# Patient Record
Sex: Male | Born: 1985 | Race: Black or African American | Hispanic: No | Marital: Single | Smoking: Current some day smoker
Health system: Southern US, Community
[De-identification: ages and names within clinical notes are randomized; demographics above are authoritative.]

## PROBLEM LIST (undated history)

## (undated) DIAGNOSIS — J40 Bronchitis, not specified as acute or chronic: Secondary | ICD-10-CM

## (undated) DIAGNOSIS — R42 Dizziness and giddiness: Secondary | ICD-10-CM

## (undated) DIAGNOSIS — J45909 Unspecified asthma, uncomplicated: Secondary | ICD-10-CM

---

## 2011-05-25 ENCOUNTER — Encounter: Payer: Self-pay | Admitting: *Deleted

## 2011-05-25 ENCOUNTER — Emergency Department (HOSPITAL_COMMUNITY)
Admission: EM | Admit: 2011-05-25 | Discharge: 2011-05-25 | Disposition: A | Discharge status: Home / Self Care | Payer: Self-pay | Attending: Emergency Medicine | Admitting: Emergency Medicine

## 2011-05-25 DIAGNOSIS — F17200 Nicotine dependence, unspecified, uncomplicated: Secondary | ICD-10-CM | POA: Insufficient documentation

## 2011-05-25 DIAGNOSIS — L0501 Pilonidal cyst with abscess: Secondary | ICD-10-CM | POA: Insufficient documentation

## 2011-05-25 DIAGNOSIS — M545 Low back pain, unspecified: Secondary | ICD-10-CM | POA: Insufficient documentation

## 2011-05-25 DIAGNOSIS — IMO0001 Reserved for inherently not codable concepts without codable children: Secondary | ICD-10-CM | POA: Insufficient documentation

## 2011-05-25 MED ORDER — OXYCODONE-ACETAMINOPHEN 5-325 MG PO TABS: 1.0000 | ORAL_TABLET | ORAL | Status: AC | PRN

## 2011-05-25 MED ORDER — DOXYCYCLINE HYCLATE 100 MG PO CAPS: 100.0000 mg | ORAL_CAPSULE | Freq: Two times a day (BID) | ORAL | Status: AC

## 2011-05-25 NOTE — ED Provider Notes (Signed)
History     CSN: [id number]  Arrival date & time 05/25/11  1441   First MD Initiated Contact with Patient 05/25/11 1635      Chief Complaint  Patient presents with  . Back Pain    (Consider location/radiation/quality/duration/timing/severity/associated sxs/prior treatment) Patient is a 26 y.o. male presenting with back pain. The history is provided by the patient and the spouse.  Back Pain  This is a new problem. Episode onset: 4 days ago. The problem has been gradually worsening. The pain is associated with no known injury. The pain is present in the gluteal region. The quality of the pain is described as aching. Radiates to: radiates to lower back. The pain is at a severity of 10/10. The pain is severe. Exacerbated by: sitting. Pertinent negatives include no chest pain, no fever, no numbness, no headaches, no abdominal pain, no perianal numbness, no paresthesias and no weakness. He has tried nothing for the symptoms.    History reviewed. No pertinent past medical history.  History reviewed. No pertinent past surgical history.  History reviewed. No pertinent family history.  History  Substance Use Topics  . Smoking status: Current Some Day Smoker  . Smokeless tobacco: Not on file  . Alcohol Use: Yes      Review of Systems  Constitutional: Negative for fever.  HENT: Negative for congestion, sore throat and neck pain.   Eyes: Negative.   Respiratory: Negative for chest tightness and shortness of breath.   Cardiovascular: Negative for chest pain.  Gastrointestinal: Negative for nausea and abdominal pain.  Genitourinary: Negative.   Musculoskeletal: Positive for back pain. Negative for joint swelling and arthralgias.  Skin: Negative.  Negative for rash and wound.  Neurological: Negative for dizziness, weakness, light-headedness, numbness, headaches and paresthesias.  Hematological: Negative.   Psychiatric/Behavioral: Negative.     Allergies  Review of patient's  allergies indicates no known allergies.  Home Medications   Current Outpatient Rx  Name Route Sig Dispense Refill  . DOXYCYCLINE HYCLATE 100 MG PO CAPS Oral Take 1 capsule (100 mg total) by mouth 2 (two) times daily. 20 capsule 0  . OXYCODONE-ACETAMINOPHEN 5-325 MG PO TABS Oral Take 1 tablet by mouth every 4 (four) hours as needed for pain. 20 tablet 0    BP 106/79  Pulse 89  Temp(Src) 99.6 F (37.6 C) (Oral)  Resp 20  SpO2 100%  Physical Exam  Nursing note and vitals reviewed. Constitutional: He is oriented to person, place, and time. He appears well-developed and well-nourished.  HENT:  Head: Normocephalic and atraumatic.  Eyes: Conjunctivae are normal.  Neck: Normal range of motion.  Cardiovascular: Normal rate, regular rhythm, normal heart sounds and intact distal pulses.   Pulmonary/Chest: Effort normal and breath sounds normal. He has no wheezes.  Abdominal: Soft. Bowel sounds are normal. There is no tenderness.  Musculoskeletal: Normal range of motion.  Neurological: He is alert and oriented to person, place, and time.  Skin: Skin is warm and dry.       Small area of induration and tenderness of right upper buttock near midline.  No fluctuance,  Skin lesion or drainage.  No erythema noted.  Psychiatric: He has a normal mood and affect.    ED Course  Procedures (including critical care time)  Labs Reviewed - No data to display No results found.   1. Pilonidal abscess     Bedside US used to assess for abscess/pus pocket with no pus pocket identified.  MDM  Doxycycline,  Oxycodone,  Warm soaks.  Pt advised he may need to return if sx worsen for I & D.  Pt understands.        Candis Musa, PA 05/25/11 1723

## 2011-05-25 NOTE — ED Notes (Signed)
Back pain for 3-4 days,  Feels better when standing, no urinary sx. Points to sacral area as site of pain

## 2011-05-25 NOTE — ED Notes (Signed)
Back pain for 4 days.  

## 2011-05-27 NOTE — ED Provider Notes (Signed)
Medical screening examination/treatment/procedure(s) were performed by non-physician practitioner and as supervising physician I was immediately available for consultation/collaboration.  Nicoletta Dress. Colon Branch, MD 05/27/11 1557

## 2015-03-14 ENCOUNTER — Emergency Department (HOSPITAL_COMMUNITY)
Admission: EM | Admit: 2015-03-14 | Discharge: 2015-03-14 | Disposition: A | Discharge status: Home / Self Care | Payer: Self-pay | Attending: Emergency Medicine | Admitting: Emergency Medicine

## 2015-03-14 ENCOUNTER — Encounter (HOSPITAL_COMMUNITY): Payer: Self-pay | Admitting: Emergency Medicine

## 2015-03-14 DIAGNOSIS — Z720 Tobacco use: Secondary | ICD-10-CM | POA: Insufficient documentation

## 2015-03-14 DIAGNOSIS — R112 Nausea with vomiting, unspecified: Secondary | ICD-10-CM | POA: Insufficient documentation

## 2015-03-14 DIAGNOSIS — R42 Dizziness and giddiness: Secondary | ICD-10-CM | POA: Insufficient documentation

## 2015-03-14 DIAGNOSIS — Z8709 Personal history of other diseases of the respiratory system: Secondary | ICD-10-CM | POA: Insufficient documentation

## 2015-03-14 HISTORY — DX: Dizziness and giddiness: R42

## 2015-03-14 HISTORY — DX: Bronchitis, not specified as acute or chronic: J40

## 2015-03-14 MED ORDER — MECLIZINE HCL 50 MG PO TABS: 50.0000 mg | ORAL_TABLET | Freq: Three times a day (TID) | ORAL | Status: DC | PRN

## 2015-03-14 MED ORDER — MECLIZINE HCL 12.5 MG PO TABS
25.0000 mg | ORAL_TABLET | Freq: Once | ORAL | Status: AC
  Administered 2015-03-14: 25 mg via ORAL
  Filled 2015-03-14: qty 2

## 2015-03-14 NOTE — ED Notes (Addendum)
Patient c/o vertigo. Per patient feels like room is spinning when he stands.Patient states dizziness started yesterday along with nausea. Denies any nausea at this time.  Denies any slurred speech, facial drooping, weakness, or headache. No neurological deficits noted. Patient reports taking medication for vertigo in past.

## 2015-03-14 NOTE — ED Provider Notes (Signed)
CSN: [id number]     Arrival date & time 03/14/15  1118 History  By signing my name below, I, Soijett Blue, attest that this documentation has been prepared under the direction and in the presence of Jerelyn Scott, MD. Electronically Signed: Soijett Blue, ED Scribe. 03/14/2015. 12:22 PM.  Chief Complaint  Patient presents with  . Dizziness      Patient is a 29 y.o. male presenting with dizziness. The history is provided by the patient. No language interpreter was used.  Dizziness Quality:  Room spinning Severity:  Mild Onset quality:  Sudden Duration:  1 day Timing:  Constant Progression:  Unchanged Chronicity:  New Context: standing up   Relieved by:  Being still Worsened by:  Movement, standing up and turning head Associated symptoms: vomiting (i episode yesterday that resolved today)   Associated symptoms: no headaches, no nausea, no vision changes and no weakness   Risk factors: hx of vertigo     HPI Comments:  Danny Joyce is a 29 y.o. male with a medical hx of vertigo who presents to the Emergency Department complaining of dizziness onset yesterday at work. He notes that it feels as if the room is spinning when he is standing and turning his head and that these symptoms are similar to his vertigo. He states that he is having associated symptoms of nausea and vomiting x yesterday but none today. He states that he has tried dramamine PTA with no relief for his symptoms. He denies HA, weakness in arms/legs, head trauma, and any other symptoms. Denies allergies to any medications.   Past Medical History  Diagnosis Date  . Vertigo   . Bronchitis    History reviewed. No pertinent past surgical history. Family History  Problem Relation Age of Onset  . Asthma Mother   . Asthma Sister    Social History  Substance Use Topics  . Smoking status: Current Some Day Smoker -- 0.02 packs/day for 2 years    Types: Cigarettes  . Smokeless tobacco: Never Used  . Alcohol Use: No     Review of Systems  Gastrointestinal: Positive for vomiting (i episode yesterday that resolved today). Negative for nausea.  Neurological: Positive for dizziness. Negative for weakness and headaches.  All other systems reviewed and are negative.     Allergies  Review of patient's allergies indicates no known allergies.  Home Medications   Prior to Admission medications   Medication Sig Start Date End Date Taking? Authorizing Provider  dimenhyDRINATE (DRAMAMINE) 50 MG tablet Take 100 mg by mouth daily as needed for dizziness.   Yes Historical Provider, MD  meclizine (ANTIVERT) 50 MG tablet Take 1 tablet (50 mg total) by mouth 3 (three) times daily as needed. 03/14/15   Jerelyn Scott, MD   BP 120/85 mmHg  Pulse 55  Temp(Src) 97.8 F (36.6 C) (Oral)  Resp 18  Ht  (1.727 m)  Wt 200 lb (90.719 kg)  BMI 30.42 kg/m2  SpO2 95%  Vitals reviewed Physical Exam  Physical Examination: General appearance - alert, well appearing, and in no distress Mental status - alert, oriented to person, place, and time Eyes - pupils equal and reactive, extraocular eye movements intact, no nystagmus, but symptoms are re-created with EOM Mouth - mucous membranes moist, pharynx normal without lesions Neck - supple, no significant adenopathy Chest - clear to auscultation, no wheezes, rales or rhonchi, symmetric air entry Heart - normal rate, regular rhythm, normal S1, S2, no murmurs, rubs, clicks or gallops Abdomen -  soft, nontender, nondistended, no masses or organomegaly Neurological - alert, oriented x 3, cranial nerves 2-12 tested and intact, strength 5/5 in extremities x 4, sensation intact Extremities - peripheral pulses normal, no pedal edema, no clubbing or cyanosis Skin - normal coloration and turgor, no rashes  ED Course  Procedures (including critical care time) DIAGNOSTIC STUDIES: Oxygen Saturation is 100% on RA, nl by my interpretation.    COORDINATION OF CARE: 12:21 PM  Discussed treatment plan with pt at bedside which includes antivert and pt agreed to plan.    Labs Review Labs Reviewed - No data to display  Imaging Review No results found.    EKG Interpretation None      MDM   Final diagnoses:  Vertigo    Pt presenting with c/o vertigo- he has had similar symptoms in the past.  Vertigo is much worse on movement of head and with change in position.  Had one episode of vomiting when symptoms began.  Tried taking dramamine without much relief.  symptosm are most c/w peripheral vertigo, I have low suspicion for central vertigo.  Pt treated with meclizine. Normal neuro exam, symtoms reproducible with turning head.  Discharged with strict return precautions.  Pt agreeable with plan.  Julious PayerI, LINKER,MARTHA K, personally performed the services described in this documentation. All medical record entries made by the scribe were at my direction and in my presence.  I have reviewed the chart and discharge instructions and agree that the record reflects my personal performance and is accurate and complete. Ethelda ChickLINKER,MARTHA K.  03/15/2015. 7:48 AM.     Jerelyn ScottMartha Linker, MD 03/15/15 [phone]

## 2015-03-14 NOTE — Discharge Instructions (Signed)
Return to the ED with any concerns including vomiting and not able to keep down liquids, changes in vision or speech, weakness of arms or legs, fainting, decreased level of alertness/lethargy, or any other alarming symptoms °

## 2015-07-03 NOTE — Addendum Note (Signed)
Encounter addended by: Jerelyn Scott, MD on: 07/03/2015  8:08 PM<BR>     Documentation filed: Letters

## 2015-08-20 ENCOUNTER — Emergency Department (HOSPITAL_COMMUNITY): Payer: Self-pay

## 2015-08-20 ENCOUNTER — Encounter (HOSPITAL_COMMUNITY): Payer: Self-pay | Admitting: Emergency Medicine

## 2015-08-20 ENCOUNTER — Emergency Department (HOSPITAL_COMMUNITY)
Admission: EM | Admit: 2015-08-20 | Discharge: 2015-08-20 | Disposition: A | Discharge status: Home / Self Care | Payer: Self-pay | Attending: Emergency Medicine | Admitting: Emergency Medicine

## 2015-08-20 DIAGNOSIS — Y939 Activity, unspecified: Secondary | ICD-10-CM | POA: Insufficient documentation

## 2015-08-20 DIAGNOSIS — S46912A Strain of unspecified muscle, fascia and tendon at shoulder and upper arm level, left arm, initial encounter: Secondary | ICD-10-CM

## 2015-08-20 DIAGNOSIS — Y999 Unspecified external cause status: Secondary | ICD-10-CM | POA: Insufficient documentation

## 2015-08-20 DIAGNOSIS — S46812A Strain of other muscles, fascia and tendons at shoulder and upper arm level, left arm, initial encounter: Secondary | ICD-10-CM | POA: Insufficient documentation

## 2015-08-20 DIAGNOSIS — S40022A Contusion of left upper arm, initial encounter: Secondary | ICD-10-CM | POA: Insufficient documentation

## 2015-08-20 DIAGNOSIS — F17210 Nicotine dependence, cigarettes, uncomplicated: Secondary | ICD-10-CM | POA: Insufficient documentation

## 2015-08-20 DIAGNOSIS — Y929 Unspecified place or not applicable: Secondary | ICD-10-CM | POA: Insufficient documentation

## 2015-08-20 DIAGNOSIS — S40012A Contusion of left shoulder, initial encounter: Secondary | ICD-10-CM

## 2015-08-20 DIAGNOSIS — X58XXXA Exposure to other specified factors, initial encounter: Secondary | ICD-10-CM | POA: Insufficient documentation

## 2015-08-20 MED ORDER — ACETAMINOPHEN 325 MG PO TABS
650.0000 mg | ORAL_TABLET | Freq: Once | ORAL | Status: AC
  Administered 2015-08-20: 650 mg via ORAL
  Filled 2015-08-20: qty 2

## 2015-08-20 MED ORDER — IBUPROFEN 800 MG PO TABS
800.0000 mg | ORAL_TABLET | Freq: Once | ORAL | Status: AC
  Administered 2015-08-20: 800 mg via ORAL
  Filled 2015-08-20: qty 1
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
## 2015-08-20 NOTE — Discharge Instructions (Signed)
The x-ray of your shoulder, and the x-ray of your elbow are negative for fracture or dislocation. Your vital signs are within normal limits. Please use Tylenol every 4 hours, or ibuprofen every 6 hours for soreness. Contusion A contusion is a deep bruise. Contusions happen when an injury causes bleeding under the skin. Symptoms of bruising include pain, swelling, and discolored skin. The skin may turn blue, purple, or yellow. HOME CARE   Rest the injured area.  If told, put ice on the injured area.  Put ice in a plastic bag.  Place a towel between your skin and the bag.  Leave the ice on for 20 minutes, 2-3 times per day.  If told, put light pressure (compression) on the injured area using an elastic bandage. Make sure the bandage is not too tight. Remove it and put it back on as told by your doctor.  If possible, raise (elevate) the injured area above the level of your heart while you are sitting or lying down.  Take over-the-counter and prescription medicines only as told by your doctor. GET HELP IF:  Your symptoms do not get better after several days of treatment.  Your symptoms get worse.  You have trouble moving the injured area. GET HELP RIGHT AWAY IF:   You have very bad pain.  You have a loss of feeling (numbness) in a hand or foot.  Your hand or foot turns pale or cold.   This information is not intended to replace advice given to you by your health care provider. Make sure you discuss any questions you have with your health care provider.   Document Released: 10/26/2007 Document Revised: 01/28/2015 Document Reviewed: 09/24/2014 Elsevier Interactive Patient Education [employer]2016 Elsevier Inc.

## 2017-08-12 ENCOUNTER — Emergency Department (HOSPITAL_COMMUNITY): Payer: Self-pay

## 2017-08-12 ENCOUNTER — Encounter (HOSPITAL_COMMUNITY): Payer: Self-pay | Admitting: Cardiology

## 2017-08-12 ENCOUNTER — Emergency Department (HOSPITAL_COMMUNITY)
Admission: EM | Admit: 2017-08-12 | Discharge: 2017-08-12 | Disposition: A | Discharge status: Home / Self Care | Payer: Self-pay | Attending: Emergency Medicine | Admitting: Emergency Medicine

## 2017-08-12 DIAGNOSIS — B078 Other viral warts: Secondary | ICD-10-CM | POA: Insufficient documentation

## 2017-08-12 DIAGNOSIS — F17210 Nicotine dependence, cigarettes, uncomplicated: Secondary | ICD-10-CM | POA: Insufficient documentation

## 2017-08-12 MED ORDER — SALICYLIC ACID 17 % EX SOLN: Freq: Two times a day (BID) | CUTANEOUS | 1 refills | Status: DC

## 2017-08-12 NOTE — ED Provider Notes (Signed)
[facility]Y [facility]PART[facility] Provider Note   CSN: [id number] Arrival date & time: 08/12/17  1113     History   Chief Complaint No chief complaint on file.   HPI Danny Joyce is a 32 y.o. male, left handed, presenting with pain at the site of hard "knot" which may be a callus that has been on his right hand for chronically.  He describes occasionally paring it down using a pedi egg.  He did this yesterday and reports he may have gone too deep as the site bled briefly but now has become painful. 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  He denies purulent drainage from the site. Denies any known trauma/puncture or h/o foreign body to this site.  The history is provided by the patient.    Past Medical History:  Diagnosis Date  . Bronchitis   . Vertigo     There are no active problems to display for this patient.   History reviewed. No pertinent surgical history.      Home Medications    Prior to Admission medications   Medication Sig Start Date End Date Taking? Authorizing Provider  dimenhyDRINATE (DRAMAMINE) 50 MG tablet Take 100 mg by mouth daily as needed for dizziness.    [provider]  meclizine (ANTIVERT) 50 MG tablet Take 1 tablet (50 mg total) by mouth 3 (three) times daily as needed. 03/14/15   Mabe, Latanya MaudlinMartha L, MD  salicylic acid-lactic acid 17 % external solution Apply topically 2 (two) times daily. Apply twice daily to affected site and let dry completely 08/12/17   Burgess AmorIdol, Elisama Thissen, PA-C    Family History Family History  Problem Relation Age of Onset  . Asthma Mother   . Asthma Sister     Social History Social History   Tobacco Use  . Smoking status: Current Some Day Smoker    Packs/day: 0.02    Years: 2.00    Pack years: 0.04    Types: Cigarettes  . Smokeless tobacco: Never Used  Substance Use Topics  . Alcohol use: No  . Drug use: No     Allergies     Patient has no known allergies.   Review of Systems Review of Systems  Constitutional: Negative for chills and fever.  HENT: Negative.   Respiratory: Negative.   Cardiovascular: Negative.   Musculoskeletal: Negative.   Skin: Positive for wound.  Neurological: Negative for weakness and numbness.     Physical Exam Updated Vital Signs BP 128/75 (BP Location: Left Arm)   Pulse 80   Temp 98.3 F (36.8 C) (Oral)   Resp 16   Ht 5\' 8"  (1.727 m)   Wt 90.7 kg (200 lb)   SpO2 96%   BMI 30.41 kg/m   Physical Exam  Constitutional: He is oriented to person, place, and time. He appears well-developed and well-nourished.  HENT:  Head: Normocephalic.  Cardiovascular: Normal rate.  Pulmonary/Chest: Effort normal.  Musculoskeletal: He exhibits tenderness.  Raised hyperkerototic lesion 4 cm diameter right volar hand midway between the thumb and index finger.  Central dark spot noted. No surrounding erythema, no red streaking. No edema.  Neurological: He is alert and oriented to person, place, and time. No sensory deficit.  Skin: Laceration noted.     ED Treatments / Results  Labs (all labs ordered are listed, but only abnormal results are displayed) Labs Reviewed - No data to display  EKG None  Radiology Dg Hand Complete Right  Result Date: 08/12/2017 CLINICAL DATA:  Right hand pain after standing down a callus 2 days ago. Clinical concern for a foreign body. EXAM: RIGHT HAND - COMPLETE 3+ VIEW COMPARISON:  None. FINDINGS: There is no evidence of fracture or dislocation. There is no evidence of arthropathy or other focal bone abnormality. Soft tissues are unremarkable. IMPRESSION: Normal examination.  No radiopaque foreign body. Electronically Signed   By: Beckie Salts M.D.   On: 08/12/2017 12:13    Procedures Procedures (including critical care time)  Medications Ordered in ED Medications - No data to display   Initial Impression / Assessment and Plan / ED Course  I have  reviewed the triage vital signs and the nursing notes.  Pertinent labs & imaging results that were available during my care of the patient were reviewed by me and considered in my medical decision making (see chart for details).     Plain films to r/o radio opaque fb. Bedside US also done to rule out soft tissue foreign body given central darker pigmentation (also a hint of this darkness extending to the edge of the lesion.  Negative for fb.  Favors viral wart.  Pt started on salicylic acid topical tx. Plan f/u for any persistent sx.  Advised pt this may take weeks to resolve.  Referrals given for obtaining pcp.  Final Clinical Impressions(s) / ED Diagnoses   Final diagnoses:  Common wart    ED Discharge Orders        Ordered    salicylic acid-lactic acid 17 % external solution  2 times daily     08/12/17 1325       Victoriano Lain 08/13/17 2130    Eber Hong, MD 08/14/17 2128

## 2017-08-12 NOTE — ED Notes (Signed)
Patient transported to X-ray 

## 2017-08-12 NOTE — Discharge Instructions (Addendum)
The treatment prescribed may take weeks to resolve this wart as there is not a quick solution for removing them except for surgical removal.  Please get rechecked if this does not improve.  [facility]  [address] [place], [place] [address] [phone]  Services [facility] offers a variety of basic health services.  Services include but are not limited to: Blood pressure checks  Heart rate checks  Blood sugar checks  Urine analysis  Rapid strep tests  Pregnancy tests.  Health education and referrals  People needing more complex services will be directed to a physician online. Using these virtual visits, doctors can evaluate and prescribe medicine and treatments. There will be no medication on-site, though [facility] will help patients fill their prescriptions at little to no cost.   For More information please go to: [email or web address]

## 2017-08-12 NOTE — ED Triage Notes (Signed)
Used pedi egg to callus on right hand 2 days ago.  Has had right hand pain since yesterday.

## 2017-08-24 ENCOUNTER — Emergency Department (HOSPITAL_COMMUNITY)
Admission: EM | Admit: 2017-08-24 | Discharge: 2017-08-24 | Disposition: A | Discharge status: Home / Self Care | Payer: Self-pay | Attending: Emergency Medicine | Admitting: Emergency Medicine

## 2017-08-24 ENCOUNTER — Other Ambulatory Visit: Payer: Self-pay

## 2017-08-24 ENCOUNTER — Encounter (HOSPITAL_COMMUNITY): Payer: Self-pay | Admitting: Emergency Medicine

## 2017-08-24 DIAGNOSIS — F17210 Nicotine dependence, cigarettes, uncomplicated: Secondary | ICD-10-CM | POA: Insufficient documentation

## 2017-08-24 DIAGNOSIS — J329 Chronic sinusitis, unspecified: Secondary | ICD-10-CM | POA: Insufficient documentation

## 2017-08-24 DIAGNOSIS — J069 Acute upper respiratory infection, unspecified: Secondary | ICD-10-CM | POA: Insufficient documentation

## 2017-08-24 MED ORDER — LORATADINE-PSEUDOEPHEDRINE ER 5-120 MG PO TB12: 1.0000 | ORAL_TABLET | Freq: Two times a day (BID) | ORAL | 0 refills | Status: DC

## 2017-08-24 MED ORDER — IBUPROFEN 800 MG PO TABS
800.0000 mg | ORAL_TABLET | Freq: Once | ORAL | Status: AC
  Administered 2017-08-24: 800 mg via ORAL
  Filled 2017-08-24: qty 1

## 2017-08-24 MED ORDER — PREDNISONE 20 MG PO TABS
40.0000 mg | ORAL_TABLET | Freq: Once | ORAL | Status: AC
  Administered 2017-08-24: 40 mg via ORAL
  Filled 2017-08-24: qty 2

## 2017-08-24 MED ORDER — DEXAMETHASONE 4 MG PO TABS: 4.0000 mg | ORAL_TABLET | Freq: Two times a day (BID) | ORAL | 0 refills | Status: DC

## 2017-08-24 MED ORDER — PSEUDOEPHEDRINE HCL 60 MG PO TABS
60.0000 mg | ORAL_TABLET | Freq: Once | ORAL | Status: AC
  Administered 2017-08-24: 60 mg via ORAL
  Filled 2017-08-24: qty 1

## 2017-08-24 NOTE — ED Triage Notes (Signed)
Pt C/O sinus pressure, runny nose, and sore throat that started a few days ago. Pt states he took "cold medicine" today and it did not help.

## 2017-08-24 NOTE — ED Provider Notes (Addendum)
[facility] EMERGENCY DEPARTMENT Provider Note   CSN: [id number] Arrival date & time: 08/24/17  2104     History   Chief Complaint Chief Complaint  Patient presents with  . Nasal Congestion    HPI Danny Joyce is a 32 y.o. male.  Patient reports upper respiratory symptoms with runny nose, sore throat, headache, and generally not feeling well.  No high fever reported.  No neck stiffness noted.  No drainage from the ears.  No difficulty with swallowing or managing secretions.  The history is provided by the patient.  URI   This is a new problem. The current episode started more than 2 days ago. The problem has been gradually worsening. There has been no fever. Associated symptoms include nausea, congestion, headaches, sinus pain, sore throat and cough. Pertinent negatives include no chest pain, no abdominal pain, no diarrhea, no dysuria, no neck pain and no wheezing. Treatments tried: OTC cold meds. The treatment provided no relief.    Past Medical History:  Diagnosis Date  . Bronchitis   . Vertigo     There are no active problems to display for this patient.   History reviewed. No pertinent surgical history.      Home Medications    Prior to Admission medications   Medication Sig Start Date End Date Taking? Authorizing Provider  dimenhyDRINATE (DRAMAMINE) 50 MG tablet Take 100 mg by mouth daily as needed for dizziness.    [provider]  meclizine (ANTIVERT) 50 MG tablet Take 1 tablet (50 mg total) by mouth 3 (three) times daily as needed. 03/14/15   Mabe, Latanya MaudlinMartha L, MD  salicylic acid-lactic acid 17 % external solution Apply topically 2 (two) times daily. Apply twice daily to affected site and let dry completely 08/12/17   Burgess AmorIdol, Julie, PA-C    Family History Family History  Problem Relation Age of Onset  . Asthma Mother   . Asthma Sister     Social History Social History   Tobacco Use  . Smoking status: Current Some Day Smoker    Packs/day: 0.02   Years: 2.00    Pack years: 0.04    Types: Cigarettes  . Smokeless tobacco: Never Used  Substance Use Topics  . Alcohol use: No  . Drug use: No     Allergies   Patient has no known allergies.   Review of Systems Review of Systems  Constitutional: Positive for appetite change. Negative for activity change.       All ROS Neg except as noted in HPI  HENT: Positive for congestion, sinus pain and sore throat. Negative for nosebleeds.   Eyes: Negative for photophobia and discharge.  Respiratory: Positive for cough. Negative for shortness of breath and wheezing.   Cardiovascular: Negative for chest pain and palpitations.  Gastrointestinal: Positive for nausea. Negative for abdominal pain, blood in stool and diarrhea.  Genitourinary: Negative for dysuria, frequency and hematuria.  Musculoskeletal: Negative for arthralgias, back pain and neck pain.  Skin: Negative.   Neurological: Positive for headaches. Negative for dizziness, seizures and speech difficulty.  Psychiatric/Behavioral: Negative for confusion and hallucinations.     Physical Exam Updated Vital Signs BP (!) 131/95 (BP Location: Right Arm)   Pulse 91   Temp 98.4 F (36.9 C) (Oral)   Resp 15   Ht 5\' 8"  (1.727 m)   Wt 90.7 kg (200 lb)   SpO2 95%   BMI 30.41 kg/m   Physical Exam  Constitutional: He is oriented to person, place, and  time. He appears well-developed and well-nourished.  Non-toxic appearance.  HENT:  Head: Normocephalic.  Right Ear: Tympanic membrane and external ear normal.  Left Ear: Tympanic membrane and external ear normal.  Nasal congestion present.  There is some swelling of the uvula.  There is mild increased redness of the posterior pharynx.  The airway is patent.  Eyes: Pupils are equal, round, and reactive to light. EOM and lids are normal.  Neck: Normal range of motion. Neck supple. Carotid bruit is not present.  Cardiovascular: Normal rate, regular rhythm, normal heart sounds, intact  distal pulses and normal pulses.  Pulmonary/Chest: Breath sounds normal. No respiratory distress.  Abdominal: Soft. Bowel sounds are normal. There is no tenderness. There is no guarding.  Musculoskeletal: Normal range of motion.  Lymphadenopathy:       Head (right side): No submandibular adenopathy present.       Head (left side): No submandibular adenopathy present.    He has no cervical adenopathy.  Neurological: He is alert and oriented to person, place, and time. He has normal strength. No cranial nerve deficit or sensory deficit.  Skin: Skin is warm and dry.  Psychiatric: He has a normal mood and affect. His speech is normal.  Nursing note and vitals reviewed.    ED Treatments / Results  Labs (all labs ordered are listed, but only abnormal results are displayed) Labs Reviewed - No data to display  EKG None  Radiology No results found.  Procedures Procedures (including critical care time)  Medications Ordered in ED Medications  pseudoephedrine (SUDAFED) tablet 60 mg (has no administration in time range)  predniSONE (DELTASONE) tablet 40 mg (has no administration in time range)  ibuprofen (ADVIL,MOTRIN) tablet 800 mg (has no administration in time range)     Initial Impression / Assessment and Plan / ED Course  I have reviewed the triage vital signs and the nursing notes.  Pertinent labs & imaging results that were available during my care of the patient were reviewed by me and considered in my medical decision making (see chart for details).       Final Clinical Impressions(s) / ED Diagnoses MDM  Vital signs reviewed.  Examination favors sinusitis and upper respiratory infection.  There is been no reported hemoptysis or high fever.  No difficulty with breathing. No neck stiffness. No unusual rash. No excessive diarrhea or vomiting. Pt has not been out of the country recently. The patient will be treated with ibuprofen with each meal and at bedtime, Claritin-D,  and a short course of steroid.  The patient is to follow-up with his primary physician or return to the emergency department if any changes in his condition, problems, or concerns.   Final diagnoses:  Sinusitis, unspecified chronicity, unspecified location  Upper respiratory tract infection, unspecified type    ED Discharge Orders        Ordered    loratadine-pseudoephedrine (CLARITIN-D 12 HOUR) 5-120 MG tablet  2 times daily     08/24/17 2221    dexamethasone (DECADRON) 4 MG tablet  2 times daily with meals     08/24/17 2221       Ivery Quale, PA-C 08/24/17 2233    Ivery Quale, PA-C 08/26/17 1034    Long, Arlyss Repress, MD 08/29/17 2048

## 2017-08-24 NOTE — Discharge Instructions (Addendum)
Your blood pressure is mildly elevated at 131/95, otherwise your vital signs within normal limits.  Your examination suggest sinusitis and upper respiratory infection.  Please increase water, juices, Gatorade, etc.  Please use Decadron and Claritin-D 2 times daily with food.  Please use 600 mg ibuprofen with breakfast, lunch, dinner, and at bedtime.  Please wash hands frequently.

## 2017-08-24 NOTE — ED Notes (Signed)
Patient states he is nauseated 

## 2018-06-14 ENCOUNTER — Encounter (HOSPITAL_COMMUNITY): Payer: Self-pay | Admitting: Emergency Medicine

## 2018-06-14 ENCOUNTER — Other Ambulatory Visit: Payer: Self-pay

## 2018-06-14 ENCOUNTER — Emergency Department (HOSPITAL_COMMUNITY)
Admission: EM | Admit: 2018-06-14 | Discharge: 2018-06-14 | Disposition: A | Discharge status: Home / Self Care | Payer: Self-pay | Attending: Emergency Medicine | Admitting: Emergency Medicine

## 2018-06-14 ENCOUNTER — Emergency Department (HOSPITAL_COMMUNITY): Payer: Self-pay

## 2018-06-14 DIAGNOSIS — Z79899 Other long term (current) drug therapy: Secondary | ICD-10-CM | POA: Insufficient documentation

## 2018-06-14 DIAGNOSIS — F17210 Nicotine dependence, cigarettes, uncomplicated: Secondary | ICD-10-CM | POA: Insufficient documentation

## 2018-06-14 DIAGNOSIS — M25562 Pain in left knee: Secondary | ICD-10-CM | POA: Insufficient documentation

## 2018-06-14 MED ORDER — PREDNISONE 20 MG PO TABS
40.0000 mg | ORAL_TABLET | Freq: Once | ORAL | Status: AC
  Administered 2018-06-14: 40 mg via ORAL
  Filled 2018-06-14: qty 2

## 2018-06-14 MED ORDER — PREDNISONE 20 MG PO TABS
ORAL_TABLET | ORAL | Status: AC
  Filled 2018-06-14: qty 1

## 2018-06-14 MED ORDER — DEXAMETHASONE 4 MG PO TABS: 4.0000 mg | ORAL_TABLET | Freq: Two times a day (BID) | ORAL | 0 refills | Status: DC

## 2018-06-14 MED ORDER — MELOXICAM 15 MG PO TABS: 15.0000 mg | ORAL_TABLET | Freq: Every day | ORAL | 0 refills | Status: DC

## 2018-06-14 MED ORDER — KETOROLAC TROMETHAMINE 10 MG PO TABS
10.0000 mg | ORAL_TABLET | Freq: Once | ORAL | Status: AC
  Administered 2018-06-14: 10 mg via ORAL
  Filled 2018-06-14: qty 1

## 2018-06-14 MED ORDER — ACETAMINOPHEN 500 MG PO TABS
1000.0000 mg | ORAL_TABLET | Freq: Once | ORAL | Status: AC
  Administered 2018-06-14: 1000 mg via ORAL
  Filled 2018-06-14: qty 2

## 2018-06-14 NOTE — ED Provider Notes (Signed)
[facility] EMERGENCY DEPARTMENT Provider Note   CSN: [id number] Arrival date & time: 06/14/18  1245     History   Chief Complaint Chief Complaint  Patient presents with  . Knee Pain    HPI Danny Joyce is a 33 y.o. male.  The history is provided by the patient.  Knee Pain  Location:  Knee Time since incident:  2 weeks Injury: no   Knee location:  L knee Pain details:    Quality:  Aching   Radiates to:  Does not radiate   Severity:  Moderate   Onset quality:  Gradual   Timing:  Intermittent   Progression:  Worsening Chronicity: acute on chronic. Dislocation: no   Relieved by:  Nothing Worsened by:  Bearing weight Ineffective treatments:  Arthritis medication Associated symptoms: decreased ROM   Associated symptoms: no back pain and no neck pain     Past Medical History:  Diagnosis Date  . Bronchitis   . Vertigo     There are no active problems to display for this patient.   History reviewed. No pertinent surgical history.      Home Medications    Prior to Admission medications   Medication Sig Start Date End Date Taking? Authorizing Provider  dexamethasone (DECADRON) 4 MG tablet Take 1 tablet (4 mg total) by mouth 2 (two) times daily with a meal. 08/24/17   Ivery Quale, PA-C  dimenhyDRINATE (DRAMAMINE) 50 MG tablet Take 100 mg by mouth daily as needed for dizziness.    [provider]  loratadine-pseudoephedrine (CLARITIN-D 12 HOUR) 5-120 MG tablet Take 1 tablet by mouth 2 (two) times daily. 08/24/17   Ivery Quale, PA-C  meclizine (ANTIVERT) 50 MG tablet Take 1 tablet (50 mg total) by mouth 3 (three) times daily as needed. 03/14/15   Mabe, Latanya Maudlin, MD  salicylic acid-lactic acid 17 % external solution Apply topically 2 (two) times daily. Apply twice daily to affected site and let dry completely 08/12/17   Burgess Amor, PA-C    Family History Family History  Problem Relation Age of Onset  . Asthma Mother   . Asthma Sister     Social  History Social History   Tobacco Use  . Smoking status: Current Some Day Smoker    Packs/day: 0.02    Years: 2.00    Pack years: 0.04    Types: Cigarettes  . Smokeless tobacco: Never Used  Substance Use Topics  . Alcohol use: No  . Drug use: No     Allergies   Patient has no known allergies.   Review of Systems Review of Systems  Constitutional: Negative for activity change.       All ROS Neg except as noted in HPI  HENT: Negative for nosebleeds.   Eyes: Negative for photophobia and discharge.  Respiratory: Negative for cough, shortness of breath and wheezing.   Cardiovascular: Negative for chest pain and palpitations.  Gastrointestinal: Negative for abdominal pain and blood in stool.  Genitourinary: Negative for dysuria, frequency and hematuria.  Musculoskeletal: Positive for arthralgias. Negative for back pain and neck pain.  Skin: Negative.   Neurological: Negative for dizziness, seizures and speech difficulty.  Psychiatric/Behavioral: Negative for confusion and hallucinations.     Physical Exam Updated Vital Signs BP (!) 130/98 (BP Location: Right Arm)   Pulse 63   Temp 98.6 F (37 C) (Oral)   Resp 18   Ht 5\' 8"  (1.727 m)   Wt 97.5 kg   SpO2 98%  BMI 32.69 kg/m   Physical Exam Vitals signs and nursing note reviewed.  Constitutional:      Appearance: He is well-developed. He is not toxic-appearing.  HENT:     Head: Normocephalic.     Right Ear: Tympanic membrane and external ear normal.     Left Ear: Tympanic membrane and external ear normal.  Eyes:     General: Lids are normal.     Pupils: Pupils are equal, round, and reactive to light.  Neck:     Musculoskeletal: Normal range of motion and neck supple.     Vascular: No carotid bruit.  Cardiovascular:     Rate and Rhythm: Normal rate and regular rhythm.     Pulses: Normal pulses.     Heart sounds: Normal heart sounds.  Pulmonary:     Effort: No respiratory distress.     Breath sounds: Normal  breath sounds.  Abdominal:     General: Bowel sounds are normal.     Palpations: Abdomen is soft.     Tenderness: There is no abdominal tenderness. There is no guarding.  Musculoskeletal:     Left knee: He exhibits decreased range of motion. He exhibits no effusion, no deformity and no erythema. Tenderness found. Lateral joint line tenderness noted.  Lymphadenopathy:     Head:     Right side of head: No submandibular adenopathy.     Left side of head: No submandibular adenopathy.     Cervical: No cervical adenopathy.  Skin:    General: Skin is warm and dry.  Neurological:     Mental Status: He is alert and oriented to person, place, and time.     Cranial Nerves: No cranial nerve deficit.     Sensory: No sensory deficit.  Psychiatric:        Speech: Speech normal.      ED Treatments / Results  Labs (all labs ordered are listed, but only abnormal results are displayed) Labs Reviewed - No data to display  EKG None  Radiology Dg Knee Complete 4 Views Left  Result Date: 06/14/2018 CLINICAL DATA:  Acute LEFT knee pain for 1 week. No known injury. Initial encounter. EXAM: LEFT KNEE - COMPLETE 4+ VIEW COMPARISON:  None. FINDINGS: No evidence of fracture, dislocation, or joint effusion. No evidence of arthropathy or other focal bone abnormality. Soft tissues are unremarkable. IMPRESSION: Negative. Electronically Signed   By: Harmon PierJeffrey  Hu M.D.   On: 06/14/2018 13:41    Procedures Procedures (including critical care time)  Medications Ordered in ED Medications - No data to display   Initial Impression / Assessment and Plan / ED Course  I have reviewed the triage vital signs and the nursing notes.  Pertinent labs & imaging results that were available during my care of the patient were reviewed by me and considered in my medical decision making (see chart for details).      Final Clinical Impressions(s) / ED Diagnoses MDM  Vital signs reviewed.  Pulse oximetry is 98% on room  air.  Within normal limits by my interpretation.  Examination reveals pain of the left knee mostly lateral.  No hot joint appreciated.  No significant effusion appreciated. X-ray of the left knee shows no evidence of fracture, dislocation, or joint effusion.  I discussed the findings with the patient in terms of which he understands.  I suspect that the patient has a knee sprain injury.  Patient already has a knee sleeve.   Final diagnoses:  Acute pain of  left knee    ED Discharge Orders         Ordered    dexamethasone (DECADRON) 4 MG tablet  2 times daily with meals     06/14/18 1617    meloxicam (MOBIC) 15 MG tablet  Daily     06/14/18 1617        I have ordered Decadron and meloxicam for the patient.  Patient is referred to orthopedics for additional evaluation and management.   Ivery QualeBryant, Johana Hopkinson, PA-C 06/14/18 2101    Loren RacerYelverton, David, MD 06/15/18 [phone]

## 2018-06-14 NOTE — ED Triage Notes (Signed)
Pt c/o LT knee pain x 2 weeks. Pain relieved with OTC medications and compression. Pt has knee sleeve. Pt stated yesterday he did a lot of heavy lifting so pain worsened. Pt ambulatory.

## 2018-06-14 NOTE — Discharge Instructions (Addendum)
Your blood pressure is slightly elevated, otherwise your vital signs are within normal limits.  Your oxygen level is 98% on room air.  Within normal limits by my interpretation.  The x-ray of your knee is negative for fracture, dislocation, or fluid in the joint/effusion.  You have some tenderness of the knee and some clicking of the knee that needs to be evaluated further by orthopedics.  Please call Dr. Romeo Apple today or tomorrow and set up an appointment as soon as possible for evaluation.  Please use your knee sleeve until seen by orthopedics.  Please use meloxicam daily with a meal.  Use Decadron 2 times daily with a meal.  Use Tylenol extra strength every 4 hours as needed for pain.

## 2018-10-03 ENCOUNTER — Ambulatory Visit (HOSPITAL_COMMUNITY)
Admission: EM | Admit: 2018-10-03 | Discharge: 2018-10-03 | Disposition: A | Discharge status: Home / Self Care | Payer: Self-pay | Attending: Family Medicine | Admitting: Family Medicine

## 2018-10-03 ENCOUNTER — Encounter (HOSPITAL_COMMUNITY): Payer: Self-pay

## 2018-10-03 ENCOUNTER — Other Ambulatory Visit: Payer: Self-pay

## 2018-10-03 DIAGNOSIS — J4521 Mild intermittent asthma with (acute) exacerbation: Secondary | ICD-10-CM

## 2018-10-03 MED ORDER — PREDNISONE 10 MG (21) PO TBPK: ORAL_TABLET | Freq: Every day | ORAL | 0 refills | Status: DC

## 2018-10-03 MED ORDER — ALBUTEROL SULFATE HFA 108 (90 BASE) MCG/ACT IN AERS: 1.0000 | INHALATION_SPRAY | Freq: Four times a day (QID) | RESPIRATORY_TRACT | 2 refills | Status: DC | PRN

## 2018-10-03 NOTE — ED Triage Notes (Signed)
Pt cc states his asthma flaring x 3 days. Pt would like to have an inhaler. Pt states it flares up mostly at night.

## 2018-10-15 NOTE — ED Provider Notes (Signed)
[facility]   [id number] 10/03/18 Arrival Time: 1617  ASSESSMENT & PLAN:  1. Mild intermittent asthma with exacerbation     Meds ordered this encounter  Medications  . albuterol (VENTOLIN HFA) 108 (90 Base) MCG/ACT inhaler    Sig: Inhale 1-2 puffs into the lungs every 6 (six) hours as needed for wheezing or shortness of breath.    Dispense:  1 Inhaler    Refill:  2  . predniSONE (STERAPRED UNI-PAK 21 TAB) 10 MG (21) TBPK tablet    Sig: Take by mouth daily. Take as directed.    Dispense:  21 tablet    Refill:  0   OTC symptom care as needed.  Reviewed expectations re: course of current medical issues. Questions answered. Outlined signs and symptoms indicating need for more acute intervention. Patient verbalized understanding. After Visit Summary given.  SUBJECTIVE: History from: patient.  Danny Joyce is a 33 y.o. male who presents with complaint of intermittent wheezing. Triggers: questions seasonal allergies. Onset of symptoms gradual, over the past three days. Describes wheezing as mild to moderate when present. Fever: no. Overall normal PO intake without n/v. Sick contacts: no. Typically his asthma is well controlled. Wheezing worse at night. Overall symptoms not limiting normal activities. Inhaler use: out of inhaler; requests refill. OTC treatment: none reported.   Social History   Tobacco Use  Smoking Status Current Some Day Smoker  . Packs/day: 0.02  . Years: 2.00  . Pack years: 0.04  . Types: Cigarettes  Smokeless Tobacco Never Used    ROS: As per HPI.   OBJECTIVE:  Vitals:   10/03/18 1708 10/03/18 1711  BP:  128/79  Pulse:  82  Resp:  18  Temp:  99.9 F (37.7 C)  TempSrc:  Oral  SpO2:  98%  Weight: 99.8 kg      General appearance: alert; NAD HEENT: nasal congestion; clear runny nose; throat irritation secondary to post-nasal drainage Neck: supple without LAD Cv: RRR without murmer Lungs: unlabored respirations, mild bilateral  expiratory wheezing; cough: absent; no significant respiratory distress Skin: warm and dry Psychological: alert and cooperative; normal mood and affect   No Known Allergies  Past Medical History:  Diagnosis Date  . Bronchitis   . Vertigo    Family History  Problem Relation Age of Onset  . Asthma Mother   . Asthma Sister    Social History   Socioeconomic History  . Marital status: Single    Spouse name: Not on file  . Number of children: Not on file  . Years of education: Not on file  . Highest education level: Not on file  Occupational History  . Not on file  Social Needs  . Financial resource strain: Not on file  . Food insecurity:    Worry: Not on file    Inability: Not on file  . Transportation needs:    Medical: Not on file    Non-medical: Not on file  Tobacco Use  . Smoking status: Current Some Day Smoker    Packs/day: 0.02    Years: 2.00    Pack years: 0.04    Types: Cigarettes  . Smokeless tobacco: Never Used  Substance and Sexual Activity  . Alcohol use: No  . Drug use: No  . Sexual activity: Not on file  Lifestyle  . Physical activity:    Days per week: Not on file    Minutes per session: Not on file  . Stress: Not on file  Relationships  .  Social connections:    Talks on phone: Not on file    Gets together: Not on file    Attends religious service: Not on file    Active member of club or organization: Not on file    Attends meetings of clubs or organizations: Not on file    Relationship status: Not on file  . Intimate partner violence:    Fear of current or ex partner: Not on file    Emotionally abused: Not on file    Physically abused: Not on file    Forced sexual activity: Not on file  Other Topics Concern  . Not on file  Social History Narrative  . Not on file            [name], [name], MD 10/15/18 [phone]

## 2018-11-07 ENCOUNTER — Other Ambulatory Visit: Payer: Self-pay

## 2018-11-07 ENCOUNTER — Ambulatory Visit (HOSPITAL_COMMUNITY)
Admission: EM | Admit: 2018-11-07 | Discharge: 2018-11-07 | Disposition: A | Discharge status: Home / Self Care | Payer: Self-pay | Attending: Internal Medicine | Admitting: Internal Medicine

## 2018-11-07 DIAGNOSIS — G8929 Other chronic pain: Secondary | ICD-10-CM

## 2018-11-07 DIAGNOSIS — M25569 Pain in unspecified knee: Secondary | ICD-10-CM

## 2018-11-07 NOTE — ED Triage Notes (Signed)
Pt here for return to work note after being out with knee pain

## 2018-11-07 NOTE — ED Notes (Signed)
Patient presented 3 pages of  "fitness to return to work form" copied 3 pages to be scanned into medical record. (pages are NOT numbered in consecutive order)

## 2018-11-07 NOTE — ED Provider Notes (Signed)
[facility]    CSN: [id number] Arrival date & time: 11/07/18  1210      History   Chief Complaint Chief Complaint  Patient presents with  . Letter for School/Work    HPI Danny Joyce is a 33 y.o. male with a remote history of left knee pain comes to urgent care requesting return to work note.   Patient sustained left knee pain in January 2020.  Symptoms have resolved.  Is able to ambulate with no difficulty.  He has chronic arthritis of the left knee and he is currently at baseline.  He would like to return to work.  HPI  Past Medical History:  Diagnosis Date  . Bronchitis   . Vertigo     There are no active problems to display for this patient.   No past surgical history on file.     Home Medications    Prior to Admission medications   Medication Sig Start Date End Date Taking? Authorizing Provider  albuterol (VENTOLIN HFA) 108 (90 Base) MCG/ACT inhaler Inhale 1-2 puffs into the lungs every 6 (six) hours as needed for wheezing or shortness of breath. 10/03/18   [name], Brian, MD  dimenhyDRINATE (DRAMAMINE) 50 MG tablet Take 100 mg by mouth daily as needed for dizziness.    [provider]  loratadine-pseudoephedrine (CLARITIN-D 12 HOUR) 5-120 MG tablet Take 1 tablet by mouth 2 (two) times daily. 08/24/17   Ivery QualeBryant, Hobson, PA-C  meclizine (ANTIVERT) 50 MG tablet Take 1 tablet (50 mg total) by mouth 3 (three) times daily as needed. 03/14/15   Mabe, Latanya MaudlinMartha L, MD  meloxicam (MOBIC) 15 MG tablet Take 1 tablet (15 mg total) by mouth daily. 06/14/18   Ivery QualeBryant, Hobson, PA-C  predniSONE (STERAPRED UNI-PAK 21 TAB) 10 MG (21) TBPK tablet Take by mouth daily. Take as directed. Patient not taking: Reported on 11/07/2018 10/03/18   [name], Brian, MD  salicylic acid-lactic acid 17 % external solution Apply topically 2 (two) times daily. Apply twice daily to affected site and let dry completely 08/12/17   Burgess AmorIdol, Julie, PA-C    Family History Family History  Problem  Relation Age of Onset  . Asthma Mother   . Asthma Sister     Social History Social History   Tobacco Use  . Smoking status: Current Some Day Smoker    Packs/day: 0.02    Years: 2.00    Pack years: 0.04    Types: Cigarettes  . Smokeless tobacco: Never Used  Substance Use Topics  . Alcohol use: No  . Drug use: No     Allergies   Patient has no known allergies.   Review of Systems Review of Systems  Genitourinary: Negative for dysuria, frequency and urgency.  Musculoskeletal: Positive for arthralgias. Negative for back pain, gait problem, joint swelling and myalgias.  Skin: Negative.   Neurological: Negative for dizziness, weakness, light-headedness, numbness and headaches.     Physical Exam Triage Vital Signs ED Triage Vitals [11/07/18 1251]  Enc Vitals Group     BP 128/75     Pulse Rate 81     Resp 18     Temp 98.3 F (36.8 C)     Temp Source Oral     SpO2 96 %     Weight      Height      Head Circumference      Peak Flow      Pain Score 0     Pain Loc  Pain Edu?      Excl. in [place]?    No data found.  Updated Vital Signs BP 128/75 (BP Location: Right Arm)   Pulse 81   Temp 98.3 F (36.8 C) (Oral)   Resp 18   SpO2 96%   Visual Acuity Right Eye Distance:   Left Eye Distance:   Bilateral Distance:    Right Eye Near:   Left Eye Near:    Bilateral Near:     Physical Exam   UC Treatments / Results  Labs (all labs ordered are listed, but only abnormal results are displayed) Labs Reviewed - No data to display  EKG None  Radiology No results found.  Procedures Procedures (including critical care time)  Medications Ordered in UC Medications - No data to display  Initial Impression / Assessment and Plan / UC Course  I have reviewed the triage vital signs and the nursing notes.  Pertinent labs & imaging results that were available during my care of the patient were reviewed by me and considered in my medical decision making (see  chart for details).     1.  Osteoarthritis of the left knee: Currently at baseline Patient is able to return to work with no restrictions Patient is advised to take Tylenol as needed or Motrin as needed for knee pain or swelling. Final Clinical Impressions(s) / UC Diagnoses   Final diagnoses:  Chronic knee pain, unspecified laterality   Discharge Instructions   None    ED Prescriptions    None     Controlled Substance Prescriptions [place] Controlled Substance Registry consulted? No   Chase Picket, MD 11/07/18 [phone]

## 2019-03-13 ENCOUNTER — Other Ambulatory Visit: Payer: Self-pay

## 2019-03-13 ENCOUNTER — Emergency Department (HOSPITAL_COMMUNITY): Admission: EM | Admit: 2019-03-13 | Discharge: 2019-03-13 | Discharge status: Against Medical Advice | Payer: Self-pay

## 2019-03-13 DIAGNOSIS — Z20822 Contact with and (suspected) exposure to covid-19: Secondary | ICD-10-CM

## 2019-03-15 LAB — NOVEL CORONAVIRUS, NAA: SARS-CoV-2, NAA: NOT DETECTED

## 2019-06-26 ENCOUNTER — Encounter (HOSPITAL_COMMUNITY): Payer: Self-pay | Admitting: Emergency Medicine

## 2019-06-26 ENCOUNTER — Emergency Department (HOSPITAL_COMMUNITY)
Admission: EM | Admit: 2019-06-26 | Discharge: 2019-06-27 | Disposition: A | Discharge status: Home / Self Care | Payer: Self-pay | Attending: Emergency Medicine | Admitting: Emergency Medicine

## 2019-06-26 ENCOUNTER — Other Ambulatory Visit: Payer: Self-pay

## 2019-06-26 DIAGNOSIS — F17210 Nicotine dependence, cigarettes, uncomplicated: Secondary | ICD-10-CM | POA: Insufficient documentation

## 2019-06-26 DIAGNOSIS — R197 Diarrhea, unspecified: Secondary | ICD-10-CM | POA: Insufficient documentation

## 2019-06-26 DIAGNOSIS — Z79899 Other long term (current) drug therapy: Secondary | ICD-10-CM | POA: Insufficient documentation

## 2019-06-26 LAB — COMPREHENSIVE METABOLIC PANEL
ALT: 18 U/L (ref 0–44)
AST: 41 U/L (ref 15–41)
Albumin: 4 g/dL (ref 3.5–5.0)
Alkaline Phosphatase: 50 U/L (ref 38–126)
Anion gap: 7 (ref 5–15)
BUN: 13 mg/dL (ref 6–20)
CO2: 26 mmol/L (ref 22–32)
Calcium: 8.6 mg/dL — ABNORMAL LOW (ref 8.9–10.3)
Chloride: 105 mmol/L (ref 98–111)
Creatinine, Ser: 1.12 mg/dL (ref 0.61–1.24)
GFR calc Af Amer: >60 mL/min (ref >60)
GFR calc non Af Amer: >60 mL/min (ref >60)
Glucose, Bld: 97 mg/dL (ref 70–99)
Potassium: 3.5 mmol/L (ref 3.5–5.1)
Sodium: 138 mmol/L (ref 135–145)
Total Bilirubin: 1 mg/dL (ref 0.3–1.2)
Total Protein: 7.1 g/dL (ref 6.5–8.1)

## 2019-06-26 LAB — CBC WITH DIFFERENTIAL/PLATELET
Abs Immature Granulocytes: 0.02 10*3/uL (ref 0.00–0.07)
Basophils Absolute: 0 10*3/uL (ref 0.0–0.1)
Basophils Relative: 1 %
Eosinophils Absolute: 0.1 10*3/uL (ref 0.0–0.5)
Eosinophils Relative: 3 %
HCT: 49.2 % (ref 39.0–52.0)
Hemoglobin: 16.5 g/dL (ref 13.0–17.0)
Immature Granulocytes: 0 %
Lymphocytes Relative: 15 %
Lymphs Abs: 0.8 10*3/uL (ref 0.7–4.0)
MCH: 28.5 pg (ref 26.0–34.0)
MCHC: 33.5 g/dL (ref 30.0–36.0)
MCV: 85.1 fL (ref 80.0–100.0)
Monocytes Absolute: 0.9 10*3/uL (ref 0.1–1.0)
Monocytes Relative: 17 %
Neutro Abs: 3.3 10*3/uL (ref 1.7–7.7)
Neutrophils Relative %: 64 %
Platelets: 164 10*3/uL (ref 150–400)
RBC: 5.78 MIL/uL (ref 4.22–5.81)
RDW: 14.4 % (ref 11.5–15.5)
WBC: 5.1 10*3/uL (ref 4.0–10.5)
nRBC: 0 % (ref 0.0–0.2)

## 2019-06-26 LAB — URINALYSIS, ROUTINE W REFLEX MICROSCOPIC
Bacteria, UA: NONE SEEN
Bilirubin Urine: NEGATIVE
Glucose, UA: NEGATIVE mg/dL
Ketones, ur: NEGATIVE mg/dL
Leukocytes,Ua: NEGATIVE
Nitrite: NEGATIVE
Protein, ur: NEGATIVE mg/dL
Specific Gravity, Urine: 1.011 (ref 1.005–1.030)
pH: 6 (ref 5.0–8.0)

## 2019-06-26 LAB — TSH: TSH: 1.99 u[IU]/mL (ref 0.350–4.500)

## 2019-06-26 MED ORDER — DICYCLOMINE HCL 10 MG PO CAPS
20.0000 mg | ORAL_CAPSULE | Freq: Once | ORAL | Status: AC
  Administered 2019-06-26: 23:00:00 20 mg via ORAL
  Filled 2019-06-26: qty 2

## 2019-06-26 NOTE — ED Triage Notes (Addendum)
Pt states he has had abdominal cramping and diarrhea since yesterday. States he went 6 times yesterday at work and 4 times today. Denies nausea and vomiting. States it happens after he eats and "it's like I can't hold my food".

## 2019-06-26 NOTE — Discharge Instructions (Addendum)
Your lab work was reassuring today.  Please use loperamide as needed for your diarrhea.  Please follow-up with gastroenterology I have included information for you to follow-up with them.  I would also recommend that you follow-up with a primary care doctor to be established and follow-up on your lab work that we had done today.

## 2019-06-26 NOTE — ED Provider Notes (Signed)
[facility] EMERGENCY DEPARTMENT Provider Note   CSN: [id number] Arrival date & time: 06/26/19  2014     History Chief Complaint  Patient presents with  . Diarrhea    Danny Joyce is a 34 y.o. male.  HPI Patient is a 34 year old male with no known medical conditions presented today with diarrhea that he first noticed yesterday.  States that he has had approximately 6 episodes yesterday and 4 episodes today.  States it seems to occur when he eats certain foods such as those that contain milk products.  He states that he eats a fair amount of greasy food but denies any right upper quadrant pain with food.  States he is achy generalized abdominal pain but no nausea.  He states the pain is intermittent and is currently not very severe but is mild and achy and persistent.  Denies any radiation of pain.  States he does not see primary care doctor and has not had any lab work done in many years.  Patient also states that he has noticed that he has lost 50 pounds over the past 5 to 6 months.  He states that he has been working a job that requires him to do significant physical activity however he does not intend to lose any weight.  He has not changed his diet in any way.  He denies any smoking history or history of cancer personally or in his family.  He denies any blood in his stool.  He describes the diarrhea as watery brown.  Patient does state that he has been peeing more frequently.  States he is never been checked for diabetes before.  He denies any heat or cold intolerance.  Patient has taken no medications for his pain/symptoms.   Patient denies any anorexia, fever, chills, dysuria or hematuria.    Past Medical History:  Diagnosis Date  . Bronchitis   . Vertigo     There are no problems to display for this patient.   History reviewed. No pertinent surgical history.     Family History  Problem Relation Age of Onset  . Asthma Mother   . Asthma Sister     Social History    Tobacco Use  . Smoking status: Current Some Day Smoker    Packs/day: 0.02    Years: 2.00    Pack years: 0.04    Types: Cigarettes  . Smokeless tobacco: Never Used  Substance Use Topics  . Alcohol use: No  . Drug use: No    Home Medications Prior to Admission medications   Medication Sig Start Date End Date Taking? Authorizing Provider  albuterol (VENTOLIN HFA) 108 (90 Base) MCG/ACT inhaler Inhale 1-2 puffs into the lungs every 6 (six) hours as needed for wheezing or shortness of breath. 10/03/18   Mardella Layman, MD  dicyclomine (BENTYL) 20 MG tablet Take 1 tablet (20 mg total) by mouth 2 (two) times daily. 06/27/19 07/27/19  Gailen Shelter, PA  dimenhyDRINATE (DRAMAMINE) 50 MG tablet Take 100 mg by mouth daily as needed for dizziness.    [provider]  loratadine-pseudoephedrine (CLARITIN-D 12 HOUR) 5-120 MG tablet Take 1 tablet by mouth 2 (two) times daily. 08/24/17   Ivery Quale, PA-C  meclizine (ANTIVERT) 50 MG tablet Take 1 tablet (50 mg total) by mouth 3 (three) times daily as needed. 03/14/15   Mabe, Latanya Maudlin, MD  meloxicam (MOBIC) 15 MG tablet Take 1 tablet (15 mg total) by mouth daily. 06/14/18   Ivery Quale, PA-C  predniSONE (STERAPRED UNI-PAK 21 TAB) 10 MG (21) TBPK tablet Take by mouth daily. Take as directed. Patient not taking: Reported on 11/07/2018 10/03/18   Mardella Layman, MD  salicylic acid-lactic acid 17 % external solution Apply topically 2 (two) times daily. Apply twice daily to affected site and let dry completely 08/12/17   Burgess Amor, PA-C    Allergies    Patient has no known allergies.  Review of Systems   Review of Systems  Constitutional: Positive for unexpected weight change. Negative for chills and fever.  HENT: Negative for congestion.   Eyes: Negative for pain.  Respiratory: Negative for cough and shortness of breath.   Cardiovascular: Negative for chest pain and leg swelling.  Gastrointestinal: Positive for diarrhea. Negative for  abdominal pain and vomiting.  Genitourinary: Negative for dysuria.  Musculoskeletal: Negative for myalgias.  Skin: Negative for rash.  Neurological: Negative for dizziness and headaches.    Physical Exam Updated Vital Signs BP 120/85   Pulse 67   Temp 98.2 F (36.8 C) (Oral)   Resp 18   Ht 5\' 8"  (1.727 m)   Wt 97.5 kg   SpO2 96%   BMI 32.69 kg/m   Physical Exam Vitals and nursing note reviewed.  Constitutional:      General: He is not in acute distress. HENT:     Head: Normocephalic and atraumatic.     Nose: Nose normal.  Eyes:     General: No scleral icterus. Cardiovascular:     Rate and Rhythm: Normal rate and regular rhythm.     Pulses: Normal pulses.     Heart sounds: Normal heart sounds.  Pulmonary:     Effort: Pulmonary effort is normal. No respiratory distress.     Breath sounds: No wheezing.  Abdominal:     Palpations: Abdomen is soft.     Tenderness: There is abdominal tenderness.     Comments: Generalized abdominal tenderness with no focal tenderness, guarding, rebound, hernia or masses.  Musculoskeletal:     Cervical back: Normal range of motion.     Right lower leg: No edema.     Left lower leg: No edema.  Skin:    General: Skin is warm and dry.     Capillary Refill: Capillary refill takes less than 2 seconds.  Neurological:     Mental Status: He is alert. Mental status is at baseline.  Psychiatric:        Mood and Affect: Mood normal.        Behavior: Behavior normal.     ED Results / Procedures / Treatments   Labs (all labs ordered are listed, but only abnormal results are displayed) Labs Reviewed  URINALYSIS, ROUTINE W REFLEX MICROSCOPIC - Abnormal; Notable for the following components:      Result Value   Hgb urine dipstick SMALL (*)    All other components within normal limits  COMPREHENSIVE METABOLIC PANEL - Abnormal; Notable for the following components:   Calcium 8.6 (*)    All other components within normal limits  CBC WITH  DIFFERENTIAL/PLATELET  TSH  HEMOGLOBIN A1C    EKG None  Radiology No results found.  Procedures Procedures (including critical care time)  Medications Ordered in ED Medications  dicyclomine (BENTYL) capsule 20 mg (20 mg Oral Given 06/26/19 2240)    ED Course  I have reviewed the triage vital signs and the nursing notes.  Pertinent labs & imaging results that were available during my care of the patient were reviewed by  me and considered in my medical decision making (see chart for details).    MDM Rules/Calculators/A&P                      Patient is a 34 year old male presented today with diarrhea started yesterday.  He has some generalized abdominal tenderness to palpation but no focal tenderness to palpation.  His exam is otherwise benign however he does state that he has had a 50 to 60 pound weight loss over the past 5 to 6 months.  This is unintentional however he has been working a more strenuous job than he was previously.  Patient states he has not seen a PCP in many years.  Heart rate is mildly elevated at 90 during initial triage.  Patient is unintentional weight loss is concerning for diabetes, hypothyroidism, cancer, or may be symptoms are related to his increased work.   Will obtain some basic labs for reference and recommend follow-up with gastroenterology/PCP.  Some concern for absorption disease such as celiac, lactose intolerance.  Screening labs unremarkable.  Does not appear to have elevated blood sugar as would be expected the patient was diabetic.  Small blood seen on dipstick he will follow-up with PCP for reevaluation of this as well as for further evaluation of his symptoms today.  Provided patient with 20 mg of Benadryl which she states clinically improved his symptoms.  Will discharge patient with Bentyl and recommendation to use loperamide.  He will follow-up with gastroenterology referral given.  The medical records were personally reviewed by myself. I  personally reviewed all lab results and interpreted all imaging studies and either concurred with their official read or contacted radiology for clarification.   This patient appears reasonably screened and I doubt any other medical condition requiring further workup, evaluation, or treatment in the ED at this time prior to discharge.   Patient's vitals are WNL apart from vital sign abnormalities discussed above, patient is in NAD, and able to ambulate in the ED at their baseline and able to tolerate PO.  Pain has been managed or a plan has been made for home management and has no complaints prior to discharge. Patient is comfortable with above plan and for discharge at this time. All questions were answered prior to disposition. Results from the ER workup discussed with the patient face to face and all questions answered to the best of my ability. The patient is safe for discharge with strict return precautions. Patient appears safe for discharge with appropriate follow-up. Conveyed my impression with the patient and they voiced understanding and are agreeable to plan.   An After Visit Summary was printed and given to the patient.  Portions of this note were generated with [employer]. Dictation errors may occur despite best attempts at proofreading.     Final Clinical Impression(s) / ED Diagnoses Final diagnoses:  Diarrhea, unspecified type    Rx / DC Orders ED Discharge Orders         Ordered    dicyclomine (BENTYL) 20 MG tablet  2 times daily     06/27/19 0000           Tedd Sias, [place] 06/27/19 2021    Fredia Sorrow, MD 06/30/19 [phone]

## 2019-06-27 LAB — HEMOGLOBIN A1C
Hgb A1c MFr Bld: 5.6 % (ref 4.8–5.6)
Mean Plasma Glucose: 114.02 mg/dL

## 2019-06-27 MED ORDER — DICYCLOMINE HCL 20 MG PO TABS: 20.0000 mg | ORAL_TABLET | Freq: Two times a day (BID) | ORAL | 0 refills | Status: DC

## 2019-07-01 ENCOUNTER — Emergency Department (HOSPITAL_COMMUNITY)
Admission: EM | Admit: 2019-07-01 | Discharge: 2019-07-01 | Disposition: A | Discharge status: Home / Self Care | Payer: No Typology Code available for payment source | Attending: Emergency Medicine | Admitting: Emergency Medicine

## 2019-07-01 ENCOUNTER — Emergency Department (HOSPITAL_COMMUNITY): Payer: No Typology Code available for payment source

## 2019-07-01 ENCOUNTER — Other Ambulatory Visit: Payer: Self-pay

## 2019-07-01 ENCOUNTER — Encounter (HOSPITAL_COMMUNITY): Payer: Self-pay

## 2019-07-01 DIAGNOSIS — Y92410 Unspecified street and highway as the place of occurrence of the external cause: Secondary | ICD-10-CM | POA: Insufficient documentation

## 2019-07-01 DIAGNOSIS — W2211XA Striking against or struck by driver side automobile airbag, initial encounter: Secondary | ICD-10-CM | POA: Insufficient documentation

## 2019-07-01 DIAGNOSIS — S60511A Abrasion of right hand, initial encounter: Secondary | ICD-10-CM | POA: Diagnosis not present

## 2019-07-01 DIAGNOSIS — Z23 Encounter for immunization: Secondary | ICD-10-CM | POA: Insufficient documentation

## 2019-07-01 DIAGNOSIS — F17210 Nicotine dependence, cigarettes, uncomplicated: Secondary | ICD-10-CM | POA: Insufficient documentation

## 2019-07-01 DIAGNOSIS — Y999 Unspecified external cause status: Secondary | ICD-10-CM | POA: Diagnosis not present

## 2019-07-01 DIAGNOSIS — S00511A Abrasion of lip, initial encounter: Secondary | ICD-10-CM | POA: Diagnosis present

## 2019-07-01 DIAGNOSIS — T148XXA Other injury of unspecified body region, initial encounter: Secondary | ICD-10-CM

## 2019-07-01 DIAGNOSIS — Y9389 Activity, other specified: Secondary | ICD-10-CM | POA: Diagnosis not present

## 2019-07-01 MED ORDER — TETANUS-DIPHTH-ACELL PERTUSSIS 5-2.5-18.5 LF-MCG/0.5 IM SUSP
0.5000 mL | Freq: Once | INTRAMUSCULAR | Status: AC
  Administered 2019-07-01: 0.5 mL via INTRAMUSCULAR
  Filled 2019-07-01: qty 0.5

## 2019-07-01 MED ORDER — CYCLOBENZAPRINE HCL 10 MG PO TABS: 10.0000 mg | ORAL_TABLET | Freq: Two times a day (BID) | ORAL | 0 refills | Status: DC | PRN

## 2019-07-01 MED ORDER — IBUPROFEN 400 MG PO TABS
600.0000 mg | ORAL_TABLET | Freq: Once | ORAL | Status: AC
  Administered 2019-07-01: 600 mg via ORAL
  Filled 2019-07-01: qty 1
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
## 2019-07-01 NOTE — ED Notes (Signed)
Pt.undrss in a gown ,vitalsigns charted.pt.has called bell.

## 2019-07-01 NOTE — ED Notes (Signed)
Pt dc'd home w/all belongings, a/o x4 

## 2019-07-01 NOTE — Discharge Instructions (Signed)
Please return for any problem.  Follow-up with your regular care provider as instructed.  Use ice and Motrin as instructed for pain.  The appropriate dose of Motrin is 600 mg taken every 8 hours with food.
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
## 2019-09-26 ENCOUNTER — Encounter (HOSPITAL_COMMUNITY): Payer: Self-pay

## 2019-09-26 ENCOUNTER — Emergency Department (HOSPITAL_COMMUNITY)
Admission: EM | Admit: 2019-09-26 | Discharge: 2019-09-27 | Disposition: A | Discharge status: Home / Self Care | Payer: 59 | Attending: Emergency Medicine | Admitting: Emergency Medicine

## 2019-09-26 ENCOUNTER — Other Ambulatory Visit: Payer: Self-pay

## 2019-09-26 DIAGNOSIS — Z79899 Other long term (current) drug therapy: Secondary | ICD-10-CM | POA: Diagnosis not present

## 2019-09-26 DIAGNOSIS — F17210 Nicotine dependence, cigarettes, uncomplicated: Secondary | ICD-10-CM | POA: Diagnosis not present

## 2019-09-26 DIAGNOSIS — R0789 Other chest pain: Secondary | ICD-10-CM | POA: Diagnosis present

## 2019-09-26 DIAGNOSIS — L03313 Cellulitis of chest wall: Secondary | ICD-10-CM | POA: Insufficient documentation

## 2019-09-26 NOTE — ED Triage Notes (Signed)
Beside right nipple.

## 2019-09-27 MED ORDER — DOXYCYCLINE HYCLATE 100 MG PO CAPS: 100.0000 mg | ORAL_CAPSULE | Freq: Two times a day (BID) | ORAL | 0 refills | Status: DC

## 2019-09-27 MED ORDER — DOXYCYCLINE HYCLATE 100 MG PO TABS
100.0000 mg | ORAL_TABLET | Freq: Once | ORAL | Status: DC
  Filled 2019-09-27: qty 1

## 2019-09-27 NOTE — Discharge Instructions (Signed)
1. Medications: doxycycline usual home medications 2. Treatment: rest, drink plenty of fluids, use warm compresses several times per day 3. Follow Up: Please followup with your primary doctor in 2-3 days for discussion of your diagnoses and further evaluation after today's visit; if you do not have a primary care doctor use the resource guide provided to find one; Please return to the ER for fevers, chills, nausea, vomiting or other signs of worsening or spreading infection

## 2019-09-27 NOTE — ED Provider Notes (Signed)
[facility]-EMERGENCY DEPT Provider Note   CSN: [id number] Arrival date & time: 09/26/19  2210     History Chief Complaint  Patient presents with  . Insect Bite    Danny Joyce is a 34 y.o. male presents to the Emergency Department complaining of gradual, persistent, progressively worsening swelling pain and redness of the right breast onset 2-3 days ago after shaving his chest.  Pt reports he has used ice and tried to squeeze it without relief.  Nothing seems to make his symptoms better.  He reports no hx of diabetes, steroid use and immunocompromise.  Denies hx of recurrent abscess.  Pt denies drainage, fever, chills, nausea, vomiting.  Pt reports he did not see an insect or spider bite him.     The history is provided by the patient and medical records. No language interpreter was used.       Past Medical History:  Diagnosis Date  . Bronchitis   . Vertigo     There are no problems to display for this patient.   History reviewed. No pertinent surgical history.     Family History  Problem Relation Age of Onset  . Asthma Mother   . Asthma Sister     Social History   Tobacco Use  . Smoking status: Current Some Day Smoker    Packs/day: 0.02    Years: 2.00    Pack years: 0.04    Types: Cigarettes  . Smokeless tobacco: Never Used  Substance Use Topics  . Alcohol use: No  . Drug use: No    Home Medications Prior to Admission medications   Medication Sig Start Date End Date Taking? Authorizing Provider  albuterol (VENTOLIN HFA) 108 (90 Base) MCG/ACT inhaler Inhale 1-2 puffs into the lungs every 6 (six) hours as needed for wheezing or shortness of breath. 10/03/18   Mardella Layman, MD  cyclobenzaprine (FLEXERIL) 10 MG tablet Take 1 tablet (10 mg total) by mouth 2 (two) times daily as needed for muscle spasms. 07/01/19   Wynetta Fines, MD  dicyclomine (BENTYL) 20 MG tablet Take 1 tablet (20 mg total) by mouth 2 (two) times daily. 06/27/19 07/27/19   Gailen Shelter, PA  dimenhyDRINATE (DRAMAMINE) 50 MG tablet Take 100 mg by mouth daily as needed for dizziness.    [provider]  doxycycline (VIBRAMYCIN) 100 MG capsule Take 1 capsule (100 mg total) by mouth 2 (two) times daily. 09/27/19   Shawnna Pancake, Dahlia Client, PA-C  loratadine-pseudoephedrine (CLARITIN-D 12 HOUR) 5-120 MG tablet Take 1 tablet by mouth 2 (two) times daily. 08/24/17   Ivery Quale, PA-C  meclizine (ANTIVERT) 50 MG tablet Take 1 tablet (50 mg total) by mouth 3 (three) times daily as needed. 03/14/15   Mabe, Latanya Maudlin, MD  meloxicam (MOBIC) 15 MG tablet Take 1 tablet (15 mg total) by mouth daily. 06/14/18   Ivery Quale, PA-C  predniSONE (STERAPRED UNI-PAK 21 TAB) 10 MG (21) TBPK tablet Take by mouth daily. Take as directed. Patient not taking: Reported on 11/07/2018 10/03/18   Mardella Layman, MD  salicylic acid-lactic acid 17 % external solution Apply topically 2 (two) times daily. Apply twice daily to affected site and let dry completely 08/12/17   Burgess Amor, PA-C    Allergies    Patient has no known allergies.  Review of Systems   Review of Systems  Constitutional: Negative for chills and fever.  Cardiovascular: Negative for chest pain.  Gastrointestinal: Negative for abdominal pain, nausea and vomiting.  Skin: Positive for color change and wound.  Allergic/Immunologic: Negative for immunocompromised state.    Physical Exam Updated Vital Signs BP 139/87 (BP Location: Right Arm)   Pulse 80   Temp 98.7 F (37.1 C) (Oral)   Resp 16   Ht 5\' 8"  (1.727 m)   Wt 97.5 kg   SpO2 96%   BMI 32.69 kg/m   Physical Exam Vitals and nursing note reviewed.  Constitutional:      General: He is not in acute distress.    Appearance: He is well-developed. He is not diaphoretic.  HENT:     Head: Normocephalic and atraumatic.  Eyes:     General: No scleral icterus.    Conjunctiva/sclera: Conjunctivae normal.  Cardiovascular:     Rate and Rhythm: Normal rate and  regular rhythm.  Pulmonary:     Effort: Pulmonary effort is normal.     Breath sounds: Normal breath sounds.  Abdominal:     General: There is no distension.     Palpations: Abdomen is soft.     Tenderness: There is no abdominal tenderness.  Musculoskeletal:     Cervical back: Normal range of motion.  Lymphadenopathy:     Cervical: No cervical adenopathy.  Skin:    General: Skin is warm and dry.     Findings: Erythema present.  Neurological:     Mental Status: He is alert and oriented to person, place, and time.     ED Results / Procedures / Treatments    Procedures Ultrasound ED Soft Tissue  Date/Time: 09/27/2019 12:14 AM Performed by: 11/27/2019, PA-C Authorized by: Dierdre Forth, PA-C   Procedure details:    Indications: localization of abscess and evaluate for cellulitis     Transverse view:  Visualized   Longitudinal view:  Visualized   Images: archived     Limitations:  Body habitus and patient compliance Location:    Location: chest     Side:  Right Findings:     abscess present    cellulitis present    no foreign body present   (including critical care time)  Medications Ordered in ED Medications  doxycycline (VIBRA-TABS) tablet 100 mg (has no administration in time range)    ED Course  I have reviewed the triage vital signs and the nursing notes.  Pertinent labs & imaging results that were available during my care of the patient were reviewed by me and considered in my medical decision making (see chart for details).    MDM Rules/Calculators/A&P                       Pt presents with small abscess and moderate area of cellulitis to the right breast, just medial to the areola.  Area is warm, indurated and TTP.  No palpable fluctuance. Dierdre Forth shows small area of abscess.  I have recommended I&D however patient wishes to try antibiotics alone at this time without I&D.  Pt counseled that this may worsen necessitating I&D.  Discussed reasons  to return to the ED.  Pt states understanding and is in agreement with the plan.     Final Clinical Impression(s) / ED Diagnoses Final diagnoses:  Cellulitis of chest wall    Rx / DC Orders ED Discharge Orders         Ordered    doxycycline (VIBRAMYCIN) 100 MG capsule  2 times daily     09/27/19 0019           Eithen Castiglia,  Gwenlyn Perking 09/27/19 0022    Molpus, Jenny Reichmann, MD 09/27/19 [phone]

## 2020-05-28 ENCOUNTER — Emergency Department (HOSPITAL_COMMUNITY)
Admission: EM | Admit: 2020-05-28 | Discharge: 2020-05-28 | Disposition: A | Discharge status: Home / Self Care | Payer: 59 | Attending: Emergency Medicine | Admitting: Emergency Medicine

## 2020-05-28 ENCOUNTER — Other Ambulatory Visit: Payer: Self-pay

## 2020-05-28 DIAGNOSIS — Z20822 Contact with and (suspected) exposure to covid-19: Secondary | ICD-10-CM

## 2020-05-28 DIAGNOSIS — U071 COVID-19: Secondary | ICD-10-CM | POA: Insufficient documentation

## 2020-05-28 DIAGNOSIS — F17210 Nicotine dependence, cigarettes, uncomplicated: Secondary | ICD-10-CM | POA: Diagnosis not present

## 2020-05-28 DIAGNOSIS — R519 Headache, unspecified: Secondary | ICD-10-CM | POA: Diagnosis present

## 2020-05-28 DIAGNOSIS — J069 Acute upper respiratory infection, unspecified: Secondary | ICD-10-CM

## 2020-05-28 LAB — RESP PANEL BY RT-PCR (FLU A&B, COVID) ARPGX2
Influenza A by PCR: NEGATIVE
Influenza B by PCR: NEGATIVE
SARS Coronavirus 2 by RT PCR: POSITIVE — AB

## 2020-05-28 LAB — POC SARS CORONAVIRUS 2 AG -  ED: SARS Coronavirus 2 Ag: NEGATIVE

## 2020-05-28 NOTE — ED Triage Notes (Signed)
Pt states he woke up with chills and a HA this morning. Pt denies known covid exposure, but states he would not mind being tested.

## 2020-05-28 NOTE — Discharge Instructions (Addendum)
At this time there does not appear to be the presence of an emergent medical condition, however there is always the potential for conditions to change. Please read and follow the below instructions.  Please return to the Emergency Department immediately for any new or worsening symptoms. Please be sure to follow up with your Primary Care Provider within one week regarding your visit today; please call their office to schedule an appointment even if you are feeling better for a follow-up visit. Your influenza test is currently in process, it should result in the next day on your MyChart account.  Please review results online and discuss them with your primary care doctor when available.  Additionally it may be too early for your Covid test to result positive, you may need a follow-up test if your symptoms do not improve please discuss that with your primary care provider at your follow-up visit this week.  Please drink plenty water to avoid dehydration, get plenty of rest.  Please continue to quarantine until asymptomatic x 3 days. Please take Ibuprofen (Advil, motrin) and Tylenol (acetaminophen) to relieve your pain.  You may take up to 400 MG (2 pills) of normal strength ibuprofen every 8 hours as needed.  You make take tylenol, up to 500 mg (one extra strength pills) every 8 hours as needed. It is safe to take ibuprofen and tylenol at the same time as they work differently. Do not take more than 3,000 mg tylenol in a 24 hour period (not more than one dose every 8 hours.  Please check all medication labels as many medications such as pain and cold medications may contain tylenol.  Do not drink alcohol while taking these medications.  Do not take other NSAID'S while taking ibuprofen (such as aleve or naproxen).  Please take ibuprofen with food to decrease stomach upset.  Go to the nearest Emergency Department immediately if: You have fever or chills You feel pain or pressure in your chest. You have  shortness of breath. You faint or feel like you will faint. You keep throwing up (vomiting). You feel confused. You have neck stiffness or vision changes You have any new/concerning or worsening of symptoms   Please read the additional information packets attached to your discharge summary.  Do not take your medicine if  develop an itchy rash, swelling in your mouth or lips, or difficulty breathing; call 911 and seek immediate emergency medical attention if this occurs.  You may review your lab tests and imaging results in their entirety on your MyChart account.  Please discuss all results of fully with your primary care provider and other specialist at your follow-up visit.  Note: Portions of this text may have been transcribed using voice recognition software. Every effort was made to ensure accuracy; however, inadvertent computerized transcription errors may still be present.

## 2020-05-28 NOTE — ED Provider Notes (Addendum)
[facility] EMERGENCY DEPARTMENT Provider Note   CSN: [id number] Arrival date & time: 05/28/20  1130     History Chief Complaint  Patient presents with  . Headache    Danny Joyce is a 35 y.o. male reports history of asthma otherwise healthy no daily medication use.  Patient presents today requesting Covid test he reports he was feeling well yesterday, this morning he woke up and felt some fatigue, body aches, headache and rhinorrhea.  He has been taking ibuprofen for his symptoms with some relief.  He describes body aches as a mild aching sensation all over constant nonradiating no aggravating factors improved with Tylenol.  Headache is a mild generalized headache as well without aggravating factors.  He reports feeling tired and cold at home but has not measured a fever.  Patient reports that he received 2 Covid vaccines in 2021 but did not receive his influenza vaccine this year.  Denies measured fever denies vision changes denies neck stiffness, sore throat, chest pain/shortness of breath, hemoptysis, productive cough, abdominal pain, nausea/vomiting, diarrhea, extremity swelling/color change, sick contacts or any additional concerns  HPI     Past Medical History:  Diagnosis Date  . Bronchitis   . Vertigo     There are no problems to display for this patient.   No past surgical history on file.     Family History  Problem Relation Age of Onset  . Asthma Mother   . Asthma Sister     Social History   Tobacco Use  . Smoking status: Current Some Day Smoker    Packs/day: 0.02    Years: 2.00    Pack years: 0.04    Types: Cigarettes  . Smokeless tobacco: Never Used  Vaping Use  . Vaping Use: Never used  Substance Use Topics  . Alcohol use: No  . Drug use: No    Home Medications Prior to Admission medications   Medication Sig Start Date End Date Taking? Authorizing Provider  albuterol (VENTOLIN HFA) 108 (90 Base) MCG/ACT inhaler Inhale 1-2 puffs into the lungs  every 6 (six) hours as needed for wheezing or shortness of breath. 10/03/18   Vanessa Kick, MD  cyclobenzaprine (FLEXERIL) 10 MG tablet Take 1 tablet (10 mg total) by mouth 2 (two) times daily as needed for muscle spasms. 07/01/19   Valarie Merino, MD  dicyclomine (BENTYL) 20 MG tablet Take 1 tablet (20 mg total) by mouth 2 (two) times daily. 06/27/19 07/27/19  Tedd Sias, PA  dimenhyDRINATE (DRAMAMINE) 50 MG tablet Take 100 mg by mouth daily as needed for dizziness.    [provider]  doxycycline (VIBRAMYCIN) 100 MG capsule Take 1 capsule (100 mg total) by mouth 2 (two) times daily. 09/27/19   Muthersbaugh, Jarrett Soho, PA-C  loratadine-pseudoephedrine (CLARITIN-D 12 HOUR) 5-120 MG tablet Take 1 tablet by mouth 2 (two) times daily. 08/24/17   Lily Kocher, PA-C  meclizine (ANTIVERT) 50 MG tablet Take 1 tablet (50 mg total) by mouth 3 (three) times daily as needed. 03/14/15   Mabe, Forbes Cellar, MD  meloxicam (MOBIC) 15 MG tablet Take 1 tablet (15 mg total) by mouth daily. 06/14/18   Lily Kocher, PA-C  predniSONE (STERAPRED UNI-PAK 21 TAB) 10 MG (21) TBPK tablet Take by mouth daily. Take as directed. Patient not taking: Reported on 11/07/2018 10/03/18   Vanessa Kick, MD  salicylic acid-lactic acid 17 % external solution Apply topically 2 (two) times daily. Apply twice daily to affected site and let dry completely  08/12/17   Burgess Amor, PA-C    Allergies    Patient has no known allergies.  Review of Systems   Review of Systems  Constitutional: Positive for chills and fatigue. Negative for fever.  HENT: Positive for rhinorrhea. Negative for sore throat, trouble swallowing and voice change.   Respiratory: Negative.  Negative for cough and shortness of breath.   Cardiovascular: Negative.  Negative for chest pain and leg swelling.  Gastrointestinal: Negative.  Negative for abdominal pain, diarrhea, nausea and vomiting.  Musculoskeletal: Positive for arthralgias and myalgias. Negative for neck  stiffness.  Neurological: Positive for headaches. Negative for weakness and numbness.    Physical Exam Updated Vital Signs BP 131/89 (BP Location: Right Arm)   Pulse 69   Temp 98.5 F (36.9 C) (Oral)   Resp 18   Ht 5\' 8"  (1.727 m)   Wt 97.5 kg   SpO2 100%   BMI 32.69 kg/m   Physical Exam Constitutional:      General: He is not in acute distress.    Appearance: Normal appearance. He is well-developed. He is not ill-appearing or diaphoretic.  HENT:     Head: Normocephalic and atraumatic.     Jaw: There is normal jaw occlusion.     Right Ear: External ear normal.     Left Ear: External ear normal.     Nose: Rhinorrhea present.     Right Sinus: No maxillary sinus tenderness or frontal sinus tenderness.     Left Sinus: No maxillary sinus tenderness or frontal sinus tenderness.     Mouth/Throat:     Mouth: Mucous membranes are moist.     Pharynx: Oropharynx is clear. Uvula midline.  Eyes:     General: Vision grossly intact. Gaze aligned appropriately.     Extraocular Movements: Extraocular movements intact.     Conjunctiva/sclera: Conjunctivae normal.     Pupils: Pupils are equal, round, and reactive to light.  Neck:     Trachea: Trachea and phonation normal.     Meningeal: Brudzinski's sign absent.  Cardiovascular:     Rate and Rhythm: Normal rate and regular rhythm.  Pulmonary:     Effort: Pulmonary effort is normal. No respiratory distress.     Breath sounds: Normal breath sounds and air entry.  Abdominal:     General: There is no distension.     Palpations: Abdomen is soft.     Tenderness: There is no abdominal tenderness. There is no guarding or rebound.  Musculoskeletal:        General: Normal range of motion.     Cervical back: Normal range of motion and neck supple.     Right lower leg: No edema.     Left lower leg: No edema.  Skin:    General: Skin is warm and dry.  Neurological:     Mental Status: He is alert.     GCS: GCS eye subscore is 4. GCS verbal  subscore is 5. GCS motor subscore is 6.     Comments: Speech is clear and goal oriented, follows commands Major Cranial nerves without deficit, no facial droop Normal strength in upper and lower extremities bilaterally including dorsiflexion and plantar flexion, strong and equal grip strength Sensation normal to light and sharp touch Moves extremities without ataxia, coordination intact Normal finger to nose and rapid alternating movements Neg romberg, no pronator drift Normal gait Normal heel-shin and balance  Psychiatric:        Behavior: Behavior normal.  ED Results / Procedures / Treatments   Labs (all labs ordered are listed, but only abnormal results are displayed) Labs Reviewed  INFLUENZA PANEL BY PCR (TYPE A & B)  POC SARS CORONAVIRUS 2 AG -  ED    EKG None  Radiology No results found.  Procedures Procedures (including critical care time)  Medications Ordered in ED Medications - No data to display  ED Course  I have reviewed the triage vital signs and the nursing notes.  Pertinent labs & imaging results that were available during my care of the patient were reviewed by me and considered in my medical decision making (see chart for details).    MDM Rules/Calculators/A&P                         Additional history obtained from: 1. Nursing notes from this visit. -------------------- 35 year old male presented with viral URI symptoms onset this morning.  He reports he is vaccinated for Covid x2 but has not had his influenza vaccine this year.  He endorses rhinorrhea, headache, body aches, fatigue as main complaints.  He denies any chest pain difficulty breathing neck stiffness sore throat vision changes or neurologic complaint.  On exam he is well-appearing no acute distress.  Cranial nerves intact, no meningeal signs, airway clear.  No evidence of PTA, RPA, Ludewig's, pharyngitis, sinusitis or other deep space infections of the head or neck.  Cardiopulmonary exam  unremarkable.  Abdomen soft nontender.  Neurovascular tact all 4 extremities without evidence of DVT.  Neurologic exam within normal limits.  A Covid antigen test ordered in triage was negative.  Will order influenza panel.  There is no indication for further work-up, no imaging blood work or antibiotics are indicated.  Will discharge patient, symptoms consistent with mild early viral URI. Patient to follow-up on his results on his MyChart account and discuss them with his PCP when available.  Work note given.  Discussed OTC anti-inflammatories, water hydration rest and quarantine precautions.  Additionally patient made aware that due to timing of his symptoms his Covid test may be a false negative and he will likely need follow-up testing through his PCP office.  Due to Covid test restrictions at this facility unable to obtain PCR test at this time given patient's mild symptoms.  At this time there does not appear to be any evidence of an acute emergency medical condition and the patient appears stable for discharge with appropriate outpatient follow up. Diagnosis was discussed with patient who verbalizes understanding of care plan and is agreeable to discharge. I have discussed return precautions with patient who verbalizes understanding. Patient encouraged to follow-up with their PCP. All questions answered.   Danny Joyce was evaluated in Emergency Department on 05/28/2020 for the symptoms described in the history of present illness. He was evaluated in the context of the global COVID-19 pandemic, which necessitated consideration that the patient might be at risk for infection with the SARS-CoV-2 virus that causes COVID-19. Institutional protocols and algorithms that pertain to the evaluation of patients at risk for COVID-19 are in a state of rapid change based on information released by regulatory bodies including the CDC and federal and state organizations. These policies and algorithms were followed  during the patient's care in the ED.  Note: Portions of this report may have been transcribed using voice recognition software. Every effort was made to ensure accuracy; however, inadvertent computerized transcription errors may still be present. Final Clinical Impression(s) /  ED Diagnoses Final diagnoses:  Viral URI  Encounter for laboratory testing for COVID-19 virus    Rx / DC Orders ED Discharge Orders    None       Bill Salinas, PA-C 05/28/20 1248    Elizabeth Palau 05/28/20 1326    Terald Sleeper, MD 05/28/20 1410

## 2020-05-30 ENCOUNTER — Telehealth: Payer: Self-pay | Admitting: Oncology

## 2020-05-30 NOTE — Telephone Encounter (Signed)
Called to discuss with patient about COVID-19 symptoms and the use of one of the available treatments for those with mild to moderate Covid symptoms and at a high risk of hospitalization.  Pt appears to qualify for outpatient treatment due to co-morbid conditions and/or a member of an at-risk group in accordance with the FDA Emergency Use Authorization.    Symptom onset: 05/28/2020 Vaccinated: yes  Booster? no Qualifiers: Asthma, obesity  Spoke to patient and he is interested in oral antivirals.   He has not up-to-date labs. We would need a current kidney function and LFTS.   He lives in [place] and would like them mailed to him if possible.   Danny Joyce

## 2021-02-04 ENCOUNTER — Ambulatory Visit
Admission: EM | Admit: 2021-02-04 | Discharge: 2021-02-04 | Disposition: A | Discharge status: Home / Self Care | Payer: 59 | Attending: Emergency Medicine | Admitting: Emergency Medicine

## 2021-02-04 ENCOUNTER — Other Ambulatory Visit: Payer: Self-pay

## 2021-02-04 DIAGNOSIS — S199XXA Unspecified injury of neck, initial encounter: Secondary | ICD-10-CM | POA: Diagnosis not present

## 2021-02-04 MED ORDER — TIZANIDINE HCL 2 MG PO TABS: 2.0000 mg | ORAL_TABLET | Freq: Four times a day (QID) | ORAL | 0 refills | Status: DC | PRN

## 2021-02-04 MED ORDER — KETOROLAC TROMETHAMINE 30 MG/ML IJ SOLN
30.0000 mg | Freq: Once | INTRAMUSCULAR | Status: AC
  Administered 2021-02-04: 30 mg via INTRAMUSCULAR

## 2021-02-04 MED ORDER — IBUPROFEN 800 MG PO TABS: 800.0000 mg | ORAL_TABLET | Freq: Three times a day (TID) | ORAL | 0 refills | Status: DC

## 2021-02-04 NOTE — ED Triage Notes (Signed)
Pt reports while assisting someone with moving an object hit him in the neck today. Patient is able to move neck to left side but not right. When attempting to nod up and down patient has limited movement.

## 2021-02-04 NOTE — ED Provider Notes (Signed)
[facility]    CSN: [id number] Arrival date & time: 02/04/21  1326      History   Chief Complaint Chief Complaint  Patient presents with   Neck Pain    HPI Danny Joyce is a 35 y.o. male presenting today for evaluation of neck pain.  Reports that he was lifting a [place] cabinet earlier today when it slipped and landed on the right aspect of his neck.  Since he has had pain and difficulty moving his neck.  He denies any vision changes.  Denies any dizziness or lightheadedness.  Has not taken anything for the pain.  HPI  Past Medical History:  Diagnosis Date   Bronchitis    Vertigo     There are no problems to display for this patient.   History reviewed. No pertinent surgical history.     Home Medications    Prior to Admission medications   Medication Sig Start Date End Date Taking? Authorizing Provider  ibuprofen (ADVIL) 800 MG tablet Take 1 tablet (800 mg total) by mouth 3 (three) times daily. 02/04/21  Yes Susette Seminara C, PA-C  tiZANidine (ZANAFLEX) 2 MG tablet Take 1-2 tablets (2-4 mg total) by mouth every 6 (six) hours as needed for muscle spasms. 02/04/21  Yes Gazelle Towe C, PA-C  albuterol (VENTOLIN HFA) 108 (90 Base) MCG/ACT inhaler Inhale 1-2 puffs into the lungs every 6 (six) hours as needed for wheezing or shortness of breath. 10/03/18   Mardella Layman, MD  dicyclomine (BENTYL) 20 MG tablet Take 1 tablet (20 mg total) by mouth 2 (two) times daily. 06/27/19 07/27/19  Gailen Shelter, PA  dimenhyDRINATE (DRAMAMINE) 50 MG tablet Take 100 mg by mouth daily as needed for dizziness.    [provider]  doxycycline (VIBRAMYCIN) 100 MG capsule Take 1 capsule (100 mg total) by mouth 2 (two) times daily. 09/27/19   Muthersbaugh, Dahlia Client, PA-C  loratadine-pseudoephedrine (CLARITIN-D 12 HOUR) 5-120 MG tablet Take 1 tablet by mouth 2 (two) times daily. 08/24/17   Ivery Quale, PA-C  meclizine (ANTIVERT) 50 MG tablet Take 1 tablet (50 mg total) by mouth 3  (three) times daily as needed. 03/14/15   Mabe, Latanya Maudlin, MD  predniSONE (STERAPRED UNI-PAK 21 TAB) 10 MG (21) TBPK tablet Take by mouth daily. Take as directed. Patient not taking: Reported on 11/07/2018 10/03/18   Mardella Layman, MD  salicylic acid-lactic acid 17 % external solution Apply topically 2 (two) times daily. Apply twice daily to affected site and let dry completely 08/12/17   Burgess Amor, PA-C    Family History Family History  Problem Relation Age of Onset   Asthma Mother    Asthma Sister     Social History Social History   Tobacco Use   Smoking status: Some Days    Packs/day: 0.02    Years: 2.00    Pack years: 0.04    Types: Cigarettes   Smokeless tobacco: Never  Vaping Use   Vaping Use: Never used  Substance Use Topics   Alcohol use: No   Drug use: No     Allergies   Patient has no known allergies.   Review of Systems Review of Systems  Constitutional:  Negative for fatigue and fever.  Eyes:  Negative for redness, itching and visual disturbance.  Respiratory:  Negative for shortness of breath.   Cardiovascular:  Negative for chest pain and leg swelling.  Gastrointestinal:  Negative for nausea and vomiting.  Musculoskeletal:  Positive for myalgias and  neck pain. Negative for arthralgias.  Skin:  Negative for color change, rash and wound.  Neurological:  Negative for dizziness, syncope, weakness, light-headedness and headaches.    Physical Exam Triage Vital Signs ED Triage Vitals [02/04/21 1337]  Enc Vitals Group     BP      Pulse      Resp      Temp      Temp src      SpO2      Weight      Height      Head Circumference      Peak Flow      Pain Score 10     Pain Loc      Pain Edu?      Excl. in GC?    No data found.  Updated Vital Signs BP 129/82 (BP Location: Right Arm)   Pulse 65   Temp 98 F (36.7 C) (Oral)   Resp 18   SpO2 97%   Visual Acuity Right Eye Distance:   Left Eye Distance:   Bilateral Distance:    Right Eye  Near:   Left Eye Near:    Bilateral Near:     Physical Exam Vitals and nursing note reviewed.  Constitutional:      Appearance: He is well-developed.     Comments: No acute distress  HENT:     Head: Normocephalic and atraumatic.     Nose: Nose normal.  Eyes:     Conjunctiva/sclera: Conjunctivae normal.  Cardiovascular:     Rate and Rhythm: Normal rate.  Pulmonary:     Effort: Pulmonary effort is normal. No respiratory distress.  Abdominal:     General: There is no distension.  Musculoskeletal:        General: Normal range of motion.     Cervical back: Neck supple.     Comments: Cervical spine: Mild tenderness to palpation of lower cervical spine midline without palpable deformity or step-off, increased tenderness throughout right cervical musculature extending into superior trapezius area  Full active range of motion although does elicit increased pain with neck extension and leftward rotation  Skin:    General: Skin is warm and dry.  Neurological:     Mental Status: He is alert and oriented to person, place, and time.     UC Treatments / Results  Labs (all labs ordered are listed, but only abnormal results are displayed) Labs Reviewed - No data to display  EKG   Radiology No results found.  Procedures Procedures (including critical care time)  Medications Ordered in UC Medications  ketorolac (TORADOL) 30 MG/ML injection 30 mg (has no administration in time range)    Initial Impression / Assessment and Plan / UC Course  I have reviewed the triage vital signs and the nursing notes.  Pertinent labs & imaging results that were available during my care of the patient were reviewed by me and considered in my medical decision making (see chart for details).     Left cervical strain-IM Toradol provided prior to discharge, continuing on NSAIDs, muscle relaxers, suspect most likely muscular strain but, but given mechanism of injury will also check cervical spine  x-ray-we will call patient with results and alter plan as needed.   Discussed strict return precautions. Patient verbalized understanding and is agreeable with plan.  Final diagnoses:  Neck injury, initial encounter     Discharge Instructions      We gave you a shot of Toradol Continue with Tylenol  and ibuprofen as needed for neck pain Supplement tizanidine-at home or bedtime-this is a muscle relaxer, will cause drowsiness, do not drive or work after taking Alternate ice and heat Gentle stretching and movement of neck Please follow-up if not improving or worsening     ED Prescriptions     Medication Sig Dispense Auth. Provider   ibuprofen (ADVIL) 800 MG tablet Take 1 tablet (800 mg total) by mouth 3 (three) times daily. 21 tablet Judythe Postema C, PA-C   tiZANidine (ZANAFLEX) 2 MG tablet Take 1-2 tablets (2-4 mg total) by mouth every 6 (six) hours as needed for muscle spasms. 30 tablet Mattias Walmsley, [place] C, PA-C      PDMP not reviewed this encounter.   Dorann Davidson, [place] C, PA-C 02/04/21 1400

## 2021-02-04 NOTE — Discharge Instructions (Signed)
We gave you a shot of Toradol Continue with Tylenol and ibuprofen as needed for neck pain Supplement tizanidine-at home or bedtime-this is a muscle relaxer, will cause drowsiness, do not drive or work after taking Alternate ice and heat Gentle stretching and movement of neck Please follow-up if not improving or worsening

## 2021-03-22 ENCOUNTER — Ambulatory Visit
Admission: EM | Admit: 2021-03-22 | Discharge: 2021-03-22 | Disposition: A | Discharge status: Home / Self Care | Payer: 59 | Attending: Emergency Medicine | Admitting: Emergency Medicine

## 2021-03-22 ENCOUNTER — Other Ambulatory Visit: Payer: Self-pay

## 2021-03-22 DIAGNOSIS — M791 Myalgia, unspecified site: Secondary | ICD-10-CM

## 2021-03-22 DIAGNOSIS — M25511 Pain in right shoulder: Secondary | ICD-10-CM | POA: Diagnosis not present

## 2021-03-22 DIAGNOSIS — M62838 Other muscle spasm: Secondary | ICD-10-CM

## 2021-03-22 MED ORDER — METHYLPREDNISOLONE 4 MG PO TBPK: ORAL_TABLET | ORAL | 0 refills | Status: DC

## 2021-03-22 MED ORDER — KETOROLAC TROMETHAMINE 60 MG/2ML IM SOLN
60.0000 mg | Freq: Once | INTRAMUSCULAR | Status: AC
  Administered 2021-03-22: 60 mg via INTRAMUSCULAR

## 2021-03-22 NOTE — Discharge Instructions (Addendum)
You were provided with an injection of ketorolac today to relieve your pain.  I recommend that you begin taking an oral steroid called methylprednisolone, this comes in the form of a Dosepak, please take 1 row of tablets daily.  I have also provided you with a muscle relaxer, baclofen, please take 1 tablet at bedtime every night for the next 5 days.  Massage therapy is also recommended as well as performing stretching exercises as tolerated, meaning without pain, multiple times throughout the day.  Once your muscle pain and tightness has resolved, you may also find it of benefit to pursue seeing a chiropractor and beginning a weight training regimen to improve upper body strength.  Massage [facility] [address] [place], [place] [address] [phone]

## 2021-03-22 NOTE — ED Triage Notes (Addendum)
Pt reports having pain in his right shoulder (patient states he might have slept on his arm the wrong way).  Started: yesterday  Patient also reports having some congestion that started this morning.

## 2021-03-22 NOTE — ED Provider Notes (Signed)
[facility]    CSN: [id number] Arrival date & time: 03/22/21  0901      History   Chief Complaint Chief Complaint  Patient presents with   Shoulder Pain    HPI Danny Joyce is a 35 y.o. male.   Patient complains of waking up yesterday morning with pain in his right shoulder, states he thinks he slept on it wrong.  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  Patient states his pain is in his right shoulder however he is left-handed, states to have difficulty driving.  States he feels very tight and tense, states the pain was worse this morning than it was yesterday.  Patient states he needs a note for work.  The history is provided by the patient.   Past Medical History:  Diagnosis Date   Bronchitis    Vertigo     There are no problems to display for this patient.   History reviewed. No pertinent surgical history.     Home Medications    Prior to Admission medications   Medication Sig Start Date End Date Taking? Authorizing Provider  methylPREDNISolone (MEDROL DOSEPAK) 4 MG TBPK tablet Take 24 mg on day 1, 20 mg on day 2, 16 mg on day 3, 12 mg on day 4, 8 mg on day 5, 4 mg on day 6. 03/22/21  Yes Theadora Rama Scales, PA-C  albuterol (VENTOLIN HFA) 108 (90 Base) MCG/ACT inhaler Inhale 1-2 puffs into the lungs every 6 (six) hours as needed for wheezing or shortness of breath. 10/03/18   Mardella Layman, MD  dicyclomine (BENTYL) 20 MG tablet Take 1 tablet (20 mg total) by mouth 2 (two) times daily. 06/27/19 07/27/19  Gailen Shelter, PA  dimenhyDRINATE (DRAMAMINE) 50 MG tablet Take 100 mg by mouth daily as needed for dizziness.    [provider]  ibuprofen (ADVIL) 800 MG tablet Take 1 tablet (800 mg total) by mouth 3 (three) times daily. 02/04/21   Wieters, Hallie C, PA-C  loratadine-pseudoephedrine (CLARITIN-D 12 HOUR) 5-120 MG tablet Take 1 tablet by mouth 2 (two) times daily. 08/24/17   Ivery Quale, PA-C   meclizine (ANTIVERT) 50 MG tablet Take 1 tablet (50 mg total) by mouth 3 (three) times daily as needed. 03/14/15   Mabe, Latanya Maudlin, MD  salicylic acid-lactic acid 17 % external solution Apply topically 2 (two) times daily. Apply twice daily to affected site and let dry completely 08/12/17   Idol, Raynelle Fanning, PA-C  tiZANidine (ZANAFLEX) 2 MG tablet Take 1-2 tablets (2-4 mg total) by mouth every 6 (six) hours as needed for muscle spasms. 02/04/21   Wieters, Junius Creamer, PA-C    Family History Family History  Problem Relation Age of Onset   Asthma Mother    Asthma Sister     Social History Social History   Tobacco Use   Smoking status: Some Days    Packs/day: 0.02    Years: 2.00    Pack years: 0.04    Types: Cigarettes   Smokeless tobacco: Never  Vaping Use   Vaping Use: Never used  Substance Use Topics   Alcohol use: No   Drug use: No     Allergies   Patient has no known allergies.   Review of Systems Review of Systems Pertinent findings noted in history of present illness.    Physical Exam Triage Vital Signs ED Triage Vitals  Enc Vitals Group  BP 03/19/21 0827 (!) 147/82     Pulse Rate 03/19/21 0827 72     Resp 03/19/21 0827 18     Temp 03/19/21 0827 98.3 F (36.8 C)     Temp Source 03/19/21 0827 Oral     SpO2 03/19/21 0827 98 %     Weight --      Height --      Head Circumference --      Peak Flow --      Pain Score 03/19/21 0826 5     Pain Loc --      Pain Edu? --      Excl. in GC? --    No data found.  Updated Vital Signs BP 134/78 (BP Location: Left Arm)   Pulse 66   Temp 98.5 F (36.9 C) (Oral)   Resp 18   SpO2 98%   Visual Acuity Right Eye Distance:   Left Eye Distance:   Bilateral Distance:    Right Eye Near:   Left Eye Near:    Bilateral Near:     Physical Exam Vitals and nursing note reviewed.  Constitutional:      General: He is not in acute distress.    Appearance: Normal appearance. He is not ill-appearing.  HENT:     Head:  Normocephalic and atraumatic.  Eyes:     General: Lids are normal.        Right eye: No discharge.        Left eye: No discharge.     Extraocular Movements: Extraocular movements intact.     Conjunctiva/sclera: Conjunctivae normal.     Right eye: Right conjunctiva is not injected.     Left eye: Left conjunctiva is not injected.  Neck:     Trachea: Trachea and phonation normal.  Cardiovascular:     Rate and Rhythm: Normal rate and regular rhythm.     Pulses: Normal pulses.     Heart sounds: Normal heart sounds. No murmur heard.   No friction rub. No gallop.  Pulmonary:     Effort: Pulmonary effort is normal. No accessory muscle usage, prolonged expiration or respiratory distress.     Breath sounds: Normal breath sounds. No stridor, decreased air movement or transmitted upper airway sounds. No decreased breath sounds, wheezing, rhonchi or rales.  Chest:     Chest wall: No tenderness.  Musculoskeletal:        General: Tenderness present. Normal range of motion.     Cervical back: Normal range of motion and neck supple. Normal range of motion.     Comments: Diffuse muscle tenderness in the cervical paraspinous muscles and upper trapezius both sides, right greater than left.  Patient also has significant muscle tightness, possible spasm in right upper trapezius and right subscapularis.  Lymphadenopathy:     Cervical: No cervical adenopathy.  Skin:    General: Skin is warm and dry.     Findings: No erythema or rash.  Neurological:     General: No focal deficit present.     Mental Status: He is alert and oriented to person, place, and time.  Psychiatric:        Mood and Affect: Mood normal.        Behavior: Behavior normal.     UC Treatments / Results  Labs (all labs ordered are listed, but only abnormal results are displayed) Labs Reviewed - No data to display  EKG   Radiology No results found.  Procedures Procedures (including critical care  time)  Medications Ordered  in UC Medications  ketorolac (TORADOL) injection 60 mg (60 mg Intramuscular Given 03/22/21 1005)    Initial Impression / Assessment and Plan / UC Course  I have reviewed the triage vital signs and the nursing notes.  Pertinent labs & imaging results that were available during my care of the patient were reviewed by me and considered in my medical decision making (see chart for details).     Patient was provided with a ketorolac injection for his pain and muscle relaxers with a Dosepak of Medrol was also provided for follow-up of his inflammation and muscle tension.  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  Patient verbalized understanding and agreement of plan as discussed.  All questions were addressed during visit.  Please see discharge instructions below for further details of plan.  Final Clinical Impressions(s) / UC Diagnoses   Final diagnoses:  Acute pain of right shoulder  Cervical paraspinous muscle spasm  Trapezius muscle spasm  Muscle tenderness     Discharge Instructions      You were provided with an injection of ketorolac today to relieve your pain.  I recommend that you begin taking an oral steroid called methylprednisolone, this comes in the form of a Dosepak, please take 1 row of tablets daily.  I have also provided you with a muscle relaxer, baclofen, please take 1 tablet at bedtime every night for the next 5 days.  Massage therapy is also recommended as well as performing stretching exercises as tolerated, meaning without pain, multiple times throughout the day.  Once your muscle pain and tightness has resolved, you may also find it of benefit to pursue seeing a chiropractor and beginning a weight training regimen to improve upper body strength.  Massage [facility] [address] [place], [place] [address] [phone]     ED Prescriptions      Medication Sig Dispense Auth. Provider   methylPREDNISolone (MEDROL DOSEPAK) 4 MG TBPK tablet Take 24 mg on day 1, 20 mg on day 2, 16 mg on day 3, 12 mg on day 4, 8 mg on day 5, 4 mg on day 6. 21 tablet Theadora Rama Scales, PA-C      PDMP not reviewed this encounter.    Theadora Rama Scales, PA-C 03/23/21 [phone]

## 2021-05-28 ENCOUNTER — Encounter (HOSPITAL_COMMUNITY): Payer: Self-pay | Admitting: *Deleted

## 2021-05-28 ENCOUNTER — Emergency Department (HOSPITAL_COMMUNITY): Payer: 59

## 2021-05-28 ENCOUNTER — Emergency Department (HOSPITAL_COMMUNITY)
Admission: EM | Admit: 2021-05-28 | Discharge: 2021-05-28 | Disposition: A | Discharge status: Home / Self Care | Payer: 59 | Attending: Emergency Medicine | Admitting: Emergency Medicine

## 2021-05-28 DIAGNOSIS — M79604 Pain in right leg: Secondary | ICD-10-CM | POA: Diagnosis not present

## 2021-05-28 DIAGNOSIS — Z79899 Other long term (current) drug therapy: Secondary | ICD-10-CM | POA: Insufficient documentation

## 2021-05-28 MED ORDER — HYDROCODONE-ACETAMINOPHEN 5-325 MG PO TABS: 1.0000 | ORAL_TABLET | Freq: Four times a day (QID) | ORAL | 0 refills | Status: DC | PRN

## 2021-05-28 MED ORDER — IBUPROFEN 800 MG PO TABS: 800.0000 mg | ORAL_TABLET | Freq: Three times a day (TID) | ORAL | 0 refills | Status: AC

## 2021-05-28 NOTE — Discharge Instructions (Signed)
Follow-up with Dr. Harrison in the next week for recheck 

## 2021-05-28 NOTE — ED Provider Notes (Signed)
[facility] EMERGENCY DEPARTMENT Provider Note   CSN: [id number] Arrival date & time: 05/28/21  1753     History  Chief Complaint  Patient presents with   Leg Pain    Danny Joyce is a 36 y.o. male.  Patient with right hip pain.  Patient had a history of vertigo  The history is provided by the patient and medical records. No language interpreter was used.  Leg Pain Location:  Hip Injury: no   Pain details:    Quality:  Aching   Radiates to:  Does not radiate   Severity:  Moderate   Onset quality:  Sudden   Timing:  Constant   Progression:  Worsening Chronicity:  New Dislocation: no   Relieved by:  Nothing Worsened by:  Nothing Associated symptoms: no back pain and no fatigue       Home Medications Prior to Admission medications   Medication Sig Start Date End Date Taking? Authorizing Provider  albuterol (VENTOLIN HFA) 108 (90 Base) MCG/ACT inhaler Inhale 1-2 puffs into the lungs every 6 (six) hours as needed for wheezing or shortness of breath. 10/03/18  Yes Hagler, Aaron Edelman, MD  HYDROcodone-acetaminophen (NORCO/VICODIN) 5-325 MG tablet Take 1 tablet by mouth every 6 (six) hours as needed. 05/28/21  Yes Milton Ferguson, MD  ibuprofen (ADVIL) 800 MG tablet Take 1 tablet (800 mg total) by mouth 3 (three) times daily. 05/28/21  Yes Milton Ferguson, MD  loratadine-pseudoephedrine (CLARITIN-D 12 HOUR) 5-120 MG tablet Take 1 tablet by mouth 2 (two) times daily. 08/24/17  Yes Lily Kocher, PA-C  dicyclomine (BENTYL) 20 MG tablet Take 1 tablet (20 mg total) by mouth 2 (two) times daily. Patient not taking: Reported on 05/28/2021 06/27/19 07/27/19  Tedd Sias, PA  meclizine (ANTIVERT) 50 MG tablet Take 1 tablet (50 mg total) by mouth 3 (three) times daily as needed. Patient not taking: Reported on 05/28/2021 03/14/15   Pixie Casino, MD  methylPREDNISolone (MEDROL DOSEPAK) 4 MG TBPK tablet Take 24 mg on day 1, 20 mg on day 2, 16 mg on day 3, 12 mg on day 4, 8 mg on day 5, 4 mg on day  6. Patient not taking: Reported on 05/28/2021 03/22/21   Lynden Oxford Scales, PA-C  salicylic acid-lactic acid 17 % external solution Apply topically 2 (two) times daily. Apply twice daily to affected site and let dry completely Patient not taking: Reported on 05/28/2021 08/12/17   Evalee Jefferson, PA-C  tiZANidine (ZANAFLEX) 2 MG tablet Take 1-2 tablets (2-4 mg total) by mouth every 6 (six) hours as needed for muscle spasms. Patient not taking: Reported on 05/28/2021 02/04/21   Debara Pickett C, PA-C      Allergies    Patient has no known allergies.    Review of Systems   Review of Systems  Constitutional:  Negative for appetite change and fatigue.  HENT:  Negative for congestion, ear discharge and sinus pressure.   Eyes:  Negative for discharge.  Respiratory:  Negative for cough.   Cardiovascular:  Negative for chest pain.  Gastrointestinal:  Negative for abdominal pain and diarrhea.  Genitourinary:  Negative for frequency and hematuria.  Musculoskeletal:  Negative for back pain.       Right hip pain  Skin:  Negative for rash.  Neurological:  Negative for seizures and headaches.  Psychiatric/Behavioral:  Negative for hallucinations.    Physical Exam Updated Vital Signs BP (!) 123/91    Pulse (!) 53    Temp 97.9 F (  36.6 C) (Oral)    Resp 18    SpO2 100%  Physical Exam Vitals and nursing note reviewed.  Constitutional:      Appearance: He is well-developed.  HENT:     Head: Normocephalic.     Nose: Nose normal.  Eyes:     General: No scleral icterus.    Conjunctiva/sclera: Conjunctivae normal.  Neck:     Thyroid: No thyromegaly.  Cardiovascular:     Rate and Rhythm: Normal rate and regular rhythm.     Heart sounds: No murmur heard.   No friction rub. No gallop.  Pulmonary:     Breath sounds: No stridor. No wheezing or rales.  Chest:     Chest wall: No tenderness.  Abdominal:     General: There is no distension.     Tenderness: There is no abdominal tenderness. There is no  rebound.  Musculoskeletal:        General: Normal range of motion.     Cervical back: Neck supple.     Comments: Mild tender right hip  Lymphadenopathy:     Cervical: No cervical adenopathy.  Skin:    Findings: No erythema or rash.  Neurological:     Mental Status: He is alert and oriented to person, place, and time.     Motor: No abnormal muscle tone.     Coordination: Coordination normal.  Psychiatric:        Behavior: Behavior normal.    ED Results / Procedures / Treatments   Labs (all labs ordered are listed, but only abnormal results are displayed) Labs Reviewed - No data to display  EKG None  Radiology DG Hip Unilat W or Wo Pelvis 2-3 Views Right  Result Date: 05/28/2021 CLINICAL DATA:  Right hip pain. EXAM: DG HIP (WITH OR WITHOUT PELVIS) 2-3V RIGHT COMPARISON:  None. FINDINGS: There is no evidence of hip fracture or dislocation. There is no evidence of arthropathy or other focal bone abnormality. IMPRESSION: Negative. Electronically Signed   By: Virgina Norfolk M.D.   On: 05/28/2021 19:40    Procedures Procedures    Medications Ordered in ED Medications - No data to display  ED Course/ Medical Decision Making/ A&P                           Medical Decision Making  Patient with right hip pain.  Patient with muscle skeletal injury to right hip.  He is given Motrin and Vicodin and will follow up with orthopedic           This patient presents to the ED for concern of right hip pain, this involves an extensive number of treatment options, and is a complaint that carries with it a high risk of complications and morbidity.  The differential diagnosis includes musculoskeletal injury to right hip, fractured hip, cancer to the femur,   Co morbidities that complicate the patient evaluation  History of vertigo   Additional history obtained:  Additional history obtained from patient External records from outside source obtained and reviewed including hospital records  reviewed   Lab Tests:  No labs  Imaging Studies ordered:  I ordered imaging studies including plain film hip I independently visualized and interpreted imaging which showed unremarkable I agree with the radiologist interpretation   Cardiac Monitoring:  No monitor  Medicines ordered and prescription drug management:   Reevaluation of the patient after these medicines showed that the patient stayed the same I have  reviewed the patients home medicines and have made adjustments as needed   Test Considered:  MRI   Critical Interventions:  None   Consultations Obtained: No consult  Problem List / ED Course:  Painful right hip   Reevaluation:  After the interventions noted above, I reevaluated the patient and found that they have :stayed the same   Social Determinants of Health: None   Dispostion:  After consideration of the diagnostic results and the patients response to treatment, I feel that the patent would benefit from discharge home with using crutches as needed and patient is given some Vicodin and will follow up with Ortho.         Final Clinical Impression(s) / ED Diagnoses Final diagnoses:  Right leg pain    Rx / DC Orders ED Discharge Orders          Ordered    ibuprofen (ADVIL) 800 MG tablet  3 times daily        05/28/21 2110    HYDROcodone-acetaminophen (NORCO/VICODIN) 5-325 MG tablet  Every 6 hours PRN        05/28/21 2111              Milton Ferguson, MD 05/30/21 1128

## 2021-05-28 NOTE — ED Triage Notes (Signed)
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

## 2021-08-17 ENCOUNTER — Other Ambulatory Visit: Payer: Self-pay

## 2021-08-17 ENCOUNTER — Ambulatory Visit (INDEPENDENT_AMBULATORY_CARE_PROVIDER_SITE_OTHER): Payer: 59

## 2021-08-17 ENCOUNTER — Ambulatory Visit
Admission: EM | Admit: 2021-08-17 | Discharge: 2021-08-17 | Disposition: A | Discharge status: Home / Self Care | Payer: 59 | Attending: Urgent Care | Admitting: Urgent Care

## 2021-08-17 DIAGNOSIS — R0602 Shortness of breath: Secondary | ICD-10-CM | POA: Diagnosis not present

## 2021-08-17 DIAGNOSIS — B9789 Other viral agents as the cause of diseases classified elsewhere: Secondary | ICD-10-CM

## 2021-08-17 DIAGNOSIS — J4530 Mild persistent asthma, uncomplicated: Secondary | ICD-10-CM

## 2021-08-17 DIAGNOSIS — R079 Chest pain, unspecified: Secondary | ICD-10-CM

## 2021-08-17 DIAGNOSIS — J988 Other specified respiratory disorders: Secondary | ICD-10-CM | POA: Diagnosis not present

## 2021-08-17 DIAGNOSIS — R0789 Other chest pain: Secondary | ICD-10-CM

## 2021-08-17 MED ORDER — PROMETHAZINE-DM 6.25-15 MG/5ML PO SYRP: 5.0000 mL | ORAL_SOLUTION | Freq: Every evening | ORAL | 0 refills | Status: DC | PRN

## 2021-08-17 MED ORDER — BENZONATATE 100 MG PO CAPS: 100.0000 mg | ORAL_CAPSULE | Freq: Three times a day (TID) | ORAL | 0 refills | Status: DC | PRN

## 2021-08-17 MED ORDER — PREDNISONE 50 MG PO TABS: 50.0000 mg | ORAL_TABLET | Freq: Every day | ORAL | 0 refills | Status: DC

## 2021-08-17 NOTE — ED Triage Notes (Signed)
Pt reports left sided chest pain, shortness of breath. Albuterol inhaler gives some relief. Denies weakness, headache, vision changes, nausea, vomiting.  ?

## 2021-08-17 NOTE — ED Provider Notes (Signed)
?[facility] ? ? ?MRN: [id number] DOB: [birth year] ? ?Subjective:  ? ?Danny Joyce is a 36 y.o. male presenting for 2 to 3-day history of acute onset shortness of breath, wheezing, left-sided chest pain. Patient has a history of asthma, uses albuterol with some relief but not today. Smokes a few cigarettes per day. No marijuana use.  No hemoptysis, throat pain, runny or stuffy nose. ? ?No current facility-administered medications for this encounter. ? ?Current Outpatient Medications:  ?  albuterol (VENTOLIN HFA) 108 (90 Base) MCG/ACT inhaler, Inhale 1-2 puffs into the lungs every 6 (six) hours as needed for wheezing or shortness of breath., Disp: 1 Inhaler, Rfl: 2 ?  dicyclomine (BENTYL) 20 MG tablet, Take 1 tablet (20 mg total) by mouth 2 (two) times daily. (Patient not taking: Reported on 05/28/2021), Disp: 60 tablet, Rfl: 0 ?  HYDROcodone-acetaminophen (NORCO/VICODIN) 5-325 MG tablet, Take 1 tablet by mouth every 6 (six) hours as needed., Disp: 20 tablet, Rfl: 0 ?  ibuprofen (ADVIL) 800 MG tablet, Take 1 tablet (800 mg total) by mouth 3 (three) times daily., Disp: 21 tablet, Rfl: 0 ?  loratadine-pseudoephedrine (CLARITIN-D 12 HOUR) 5-120 MG tablet, Take 1 tablet by mouth 2 (two) times daily., Disp: 20 tablet, Rfl: 0 ?  meclizine (ANTIVERT) 50 MG tablet, Take 1 tablet (50 mg total) by mouth 3 (three) times daily as needed. (Patient not taking: Reported on 05/28/2021), Disp: 30 tablet, Rfl: 0 ?  methylPREDNISolone (MEDROL DOSEPAK) 4 MG TBPK tablet, Take 24 mg on day 1, 20 mg on day 2, 16 mg on day 3, 12 mg on day 4, 8 mg on day 5, 4 mg on day 6. (Patient not taking: Reported on 05/28/2021), Disp: 21 tablet, Rfl: 0 ?  salicylic acid-lactic acid 17 % external solution, Apply topically 2 (two) times daily. Apply twice daily to affected site and let dry completely (Patient not taking: Reported on 05/28/2021), Disp: 14 mL, Rfl: 1 ?  tiZANidine (ZANAFLEX) 2 MG tablet, Take 1-2 tablets (2-4 mg total) by mouth  every 6 (six) hours as needed for muscle spasms. (Patient not taking: Reported on 05/28/2021), Disp: 30 tablet, Rfl: 0  ? ?No Known Allergies ? ?Past Medical History:  ?Diagnosis Date  ? Bronchitis   ? Vertigo   ?  ? ?History reviewed. No pertinent surgical history. ? ?Family History  ?Problem Relation Age of Onset  ? Asthma Mother   ? Asthma Sister   ? ? ?Social History  ? ?Tobacco Use  ? Smoking status: Some Days  ?  Packs/day: 0.02  ?  Years: 2.00  ?  Pack years: 0.04  ?  Types: Cigarettes  ? Smokeless tobacco: Never  ?Vaping Use  ? Vaping Use: Never used  ?Substance Use Topics  ? Alcohol use: No  ? Drug use: No  ? ? ?ROS ? ? ?Objective:  ? ?Vitals: ?BP 116/81 (BP Location: Right Arm)   Pulse 66   Resp 18   SpO2 97%  ? ?Physical Exam ?Constitutional:   ?   General: He is not in acute distress. ?   Appearance: Normal appearance. He is well-developed. He is not ill-appearing, toxic-appearing or diaphoretic.  ?HENT:  ?   Head: Normocephalic and atraumatic.  ?   Right Ear: External ear normal.  ?   Left Ear: External ear normal.  ?   Nose: Nose normal.  ?   Mouth/Throat:  ?   Mouth: Mucous membranes are moist.  ?Eyes:  ?   General:  No scleral icterus.    ?   Right eye: No discharge.     ?   Left eye: No discharge.  ?   Extraocular Movements: Extraocular movements intact.  ?Cardiovascular:  ?   Rate and Rhythm: Normal rate and regular rhythm.  ?   Heart sounds: Normal heart sounds. No murmur heard. ?  No friction rub. No gallop.  ?Pulmonary:  ?   Effort: Pulmonary effort is normal. No respiratory distress.  ?   Breath sounds: Normal breath sounds. No stridor. No wheezing, rhonchi or rales.  ?Neurological:  ?   Mental Status: He is alert and oriented to person, place, and time.  ?Psychiatric:     ?   Mood and Affect: Mood normal.     ?   Behavior: Behavior normal.     ?   Thought Content: Thought content normal.  ? ? ?ED ECG REPORT ? ? Date: 08/17/2021 ? EKG Time: 3:39 PM ? Rate: 62bpm ? Rhythm: normal sinus rhythm,   there are no previous tracings available for comparison ? Axis: Normal ? Intervals:none ? ST&T Change: none ? Narrative Interpretation: Sinus rhythm at 62 bpm.  No previous EKG available for comparison. ? ?DG Chest 2 View ? ?Result Date: 08/17/2021 ?CLINICAL DATA:  Left-sided chest pain for 2 days, tobacco abuse EXAM: CHEST - 2 VIEW COMPARISON:  07/01/2019 FINDINGS: Frontal and lateral views of the chest demonstrate an unremarkable cardiac silhouette. No acute airspace disease, effusion, or pneumothorax. No acute bony abnormality. IMPRESSION: 1. No acute intrathoracic process. Electronically Signed   By: Sharlet Salina M.D.   On: 08/17/2021 15:50   ? ?Assessment and Plan :  ? ?PDMP not reviewed this encounter. ? ?1. Viral respiratory illness   ?2. Mild persistent asthma without complication   ?3. Left-sided chest pain   ?4. Shortness of breath   ? ?In the context of his asthma and respiratory symptoms, smoking, recommended an oral prednisone course.  Suspect viral URI, viral syndrome. Physical exam findings reassuring and vital signs stable for discharge. Advised supportive care, offered symptomatic relief. Counseled patient on potential for adverse effects with medications prescribed/recommended today, ER and return-to-clinic precautions discussed, patient verbalized understanding.  ? ?  ?Wallis Bamberg, PA-C ?08/17/21 1555 ? ?

## 2021-12-04 ENCOUNTER — Encounter (HOSPITAL_COMMUNITY): Payer: Self-pay | Admitting: Emergency Medicine

## 2021-12-04 ENCOUNTER — Emergency Department (HOSPITAL_COMMUNITY)
Admission: EM | Admit: 2021-12-04 | Discharge: 2021-12-04 | Disposition: A | Discharge status: Home / Self Care | Payer: 59 | Attending: Emergency Medicine | Admitting: Emergency Medicine

## 2021-12-04 ENCOUNTER — Other Ambulatory Visit: Payer: Self-pay

## 2021-12-04 DIAGNOSIS — R197 Diarrhea, unspecified: Secondary | ICD-10-CM | POA: Diagnosis not present

## 2021-12-04 DIAGNOSIS — R1084 Generalized abdominal pain: Secondary | ICD-10-CM | POA: Diagnosis present

## 2021-12-04 DIAGNOSIS — J45909 Unspecified asthma, uncomplicated: Secondary | ICD-10-CM | POA: Diagnosis not present

## 2021-12-04 HISTORY — DX: Unspecified asthma, uncomplicated: J45.909

## 2021-12-04 LAB — CBC
HCT: 49.5 % (ref 39.0–52.0)
Hemoglobin: 17.2 g/dL — ABNORMAL HIGH (ref 13.0–17.0)
MCH: 29.5 pg (ref 26.0–34.0)
MCHC: 34.7 g/dL (ref 30.0–36.0)
MCV: 84.9 fL (ref 80.0–100.0)
Platelets: 163 10*3/uL (ref 150–400)
RBC: 5.83 MIL/uL — ABNORMAL HIGH (ref 4.22–5.81)
RDW: 13.5 % (ref 11.5–15.5)
WBC: 6.1 10*3/uL (ref 4.0–10.5)
nRBC: 0 % (ref 0.0–0.2)

## 2021-12-04 LAB — COMPREHENSIVE METABOLIC PANEL
ALT: 21 U/L (ref 0–44)
AST: 30 U/L (ref 15–41)
Albumin: 3.8 g/dL (ref 3.5–5.0)
Alkaline Phosphatase: 39 U/L (ref 38–126)
Anion gap: 4 — ABNORMAL LOW (ref 5–15)
BUN: 10 mg/dL (ref 6–20)
CO2: 27 mmol/L (ref 22–32)
Calcium: 8.8 mg/dL — ABNORMAL LOW (ref 8.9–10.3)
Chloride: 108 mmol/L (ref 98–111)
Creatinine, Ser: 1.01 mg/dL (ref 0.61–1.24)
GFR, Estimated: >60 mL/min (ref >60)
Glucose, Bld: 100 mg/dL — ABNORMAL HIGH (ref 70–99)
Potassium: 4 mmol/L (ref 3.5–5.1)
Sodium: 139 mmol/L (ref 135–145)
Total Bilirubin: 1.4 mg/dL — ABNORMAL HIGH (ref 0.3–1.2)
Total Protein: 7.1 g/dL (ref 6.5–8.1)

## 2021-12-04 LAB — URINALYSIS, ROUTINE W REFLEX MICROSCOPIC
Bacteria, UA: NONE SEEN
Bilirubin Urine: NEGATIVE
Glucose, UA: NEGATIVE mg/dL
Ketones, ur: NEGATIVE mg/dL
Leukocytes,Ua: NEGATIVE
Nitrite: NEGATIVE
Protein, ur: NEGATIVE mg/dL
Specific Gravity, Urine: 1.008 (ref 1.005–1.030)
pH: 8 (ref 5.0–8.0)

## 2021-12-04 LAB — LIPASE, BLOOD: Lipase: 28 U/L (ref 11–51)

## 2021-12-04 MED ORDER — DICYCLOMINE HCL 20 MG PO TABS: 20.0000 mg | ORAL_TABLET | Freq: Two times a day (BID) | ORAL | 0 refills | Status: DC

## 2021-12-04 MED ORDER — DICYCLOMINE HCL 10 MG PO CAPS
20.0000 mg | ORAL_CAPSULE | Freq: Once | ORAL | Status: AC
  Administered 2021-12-04: 20 mg via ORAL
  Filled 2021-12-04: qty 2

## 2021-12-04 MED ORDER — ONDANSETRON 4 MG PO TBDP: 4.0000 mg | ORAL_TABLET | Freq: Three times a day (TID) | ORAL | 0 refills | Status: DC | PRN

## 2021-12-04 NOTE — Discharge Instructions (Signed)
Please drink plenty of water, follow the diarrhea food recommendations that I printed for you--generally recommend bland food such as bananas, applesauce, toast, rice, rice cakes, avocados.  Follow-up with your primary care provider.

## 2021-12-04 NOTE — ED Provider Notes (Signed)
[facility] Provider Note   CSN: [id number] Arrival date & time: 12/04/21  1059     History  Chief Complaint  Patient presents with   Abdominal Pain    Danny Joyce is a 36 y.o. male.   Abdominal Pain  Patient is an 36 year old male with a past medical history significant for vertigo asthma bronchitis  Patient presented emergency room today with abdominal pain for proximally 3 days.  He states it is a crampy generalized abdominal pain he states he actually has no pain currently.  He states that he has had diarrhea but no nausea or vomiting.  He denies any fevers, recent antibiotics, recent travel and denies any blood in his stool.  He states he is having 5 episodes of watery diarrhea per day.  Denies any chest pain difficulty breathing cough congestion lightheadedness or dizziness.  No other associate symptoms.  He is taking medications prior to arrival.    Home Medications Prior to Admission medications   Medication Sig Start Date End Date Taking? Authorizing Provider  dicyclomine (BENTYL) 20 MG tablet Take 1 tablet (20 mg total) by mouth 2 (two) times daily. 12/04/21  Yes Elizebeth Kluesner S, PA  ondansetron (ZOFRAN-ODT) 4 MG disintegrating tablet Take 1 tablet (4 mg total) by mouth every 8 (eight) hours as needed for nausea or vomiting. 12/04/21  Yes Asia Favata S, PA  albuterol (VENTOLIN HFA) 108 (90 Base) MCG/ACT inhaler Inhale 1-2 puffs into the lungs every 6 (six) hours as needed for wheezing or shortness of breath. 10/03/18   Vanessa Kick, MD  benzonatate (TESSALON) 100 MG capsule Take 1-2 capsules (100-200 mg total) by mouth 3 (three) times daily as needed for cough. 08/17/21   Jaynee Eagles, PA-C  HYDROcodone-acetaminophen (NORCO/VICODIN) 5-325 MG tablet Take 1 tablet by mouth every 6 (six) hours as needed. 05/28/21   Milton Ferguson, MD  ibuprofen (ADVIL) 800 MG tablet Take 1 tablet (800 mg total) by mouth 3 (three) times daily. 05/28/21   Milton Ferguson, MD   loratadine-pseudoephedrine (CLARITIN-D 12 HOUR) 5-120 MG tablet Take 1 tablet by mouth 2 (two) times daily. 08/24/17   Lily Kocher, PA-C  meclizine (ANTIVERT) 50 MG tablet Take 1 tablet (50 mg total) by mouth 3 (three) times daily as needed. Patient not taking: Reported on 05/28/2021 03/14/15   Pixie Casino, MD  methylPREDNISolone (MEDROL DOSEPAK) 4 MG TBPK tablet Take 24 mg on day 1, 20 mg on day 2, 16 mg on day 3, 12 mg on day 4, 8 mg on day 5, 4 mg on day 6. Patient not taking: Reported on 05/28/2021 03/22/21   Lynden Oxford Scales, PA-C  predniSONE (DELTASONE) 50 MG tablet Take 1 tablet (50 mg total) by mouth daily with breakfast. 08/17/21   Jaynee Eagles, PA-C  promethazine-dextromethorphan (PROMETHAZINE-DM) 6.25-15 MG/5ML syrup Take 5 mLs by mouth at bedtime as needed for cough. 08/17/21   Jaynee Eagles, PA-C  salicylic acid-lactic acid 17 % external solution Apply topically 2 (two) times daily. Apply twice daily to affected site and let dry completely Patient not taking: Reported on 05/28/2021 08/12/17   Evalee Jefferson, PA-C  tiZANidine (ZANAFLEX) 2 MG tablet Take 1-2 tablets (2-4 mg total) by mouth every 6 (six) hours as needed for muscle spasms. Patient not taking: Reported on 05/28/2021 02/04/21   Debara Pickett C, PA-C      Allergies    Patient has no known allergies.    Review of Systems   Review of Systems  Gastrointestinal:  Positive for abdominal pain.    Physical Exam Updated Vital Signs BP 126/83 (BP Location: Left Arm)   Pulse 61   Temp 98.3 F (36.8 C) (Oral)   Resp 15   Ht 5\' 8"  (1.727 m)   Wt 97.5 kg   SpO2 100%   BMI 32.68 kg/m  Physical Exam Vitals and nursing note reviewed.  Constitutional:      General: He is not in acute distress. HENT:     Head: Normocephalic and atraumatic.     Nose: Nose normal.  Eyes:     General: No scleral icterus. Cardiovascular:     Rate and Rhythm: Normal rate and regular rhythm.     Pulses: Normal pulses.     Heart sounds: Normal  heart sounds.  Pulmonary:     Effort: Pulmonary effort is normal. No respiratory distress.     Breath sounds: No wheezing.  Abdominal:     Palpations: Abdomen is soft.     Tenderness: There is no abdominal tenderness.  Musculoskeletal:     Cervical back: Normal range of motion.     Right lower leg: No edema.     Left lower leg: No edema.  Skin:    General: Skin is warm and dry.     Capillary Refill: Capillary refill takes less than 2 seconds.  Neurological:     Mental Status: He is alert. Mental status is at baseline.  Psychiatric:        Mood and Affect: Mood normal.        Behavior: Behavior normal.     ED Results / Procedures / Treatments   Labs (all labs ordered are listed, but only abnormal results are displayed) Labs Reviewed  COMPREHENSIVE METABOLIC PANEL - Abnormal; Notable for the following components:      Result Value   Glucose, Bld 100 (*)    Calcium 8.8 (*)    Total Bilirubin 1.4 (*)    Anion gap 4 (*)    All other components within normal limits  CBC - Abnormal; Notable for the following components:   RBC 5.83 (*)    Hemoglobin 17.2 (*)    All other components within normal limits  URINALYSIS, ROUTINE W REFLEX MICROSCOPIC - Abnormal; Notable for the following components:   Color, Urine STRAW (*)    Hgb urine dipstick SMALL (*)    All other components within normal limits  LIPASE, BLOOD    EKG None  Radiology No results found.  Procedures Procedures    Medications Ordered in ED Medications  dicyclomine (BENTYL) capsule 20 mg (has no administration in time range)    ED Course/ Medical Decision Making/ A&P Clinical Course as of 12/04/21 1420  Sat Dec 04, 2021  1413 3 days of diarrhea and fatigue and some crampy abd pain as well. No fevers.  [WF]  1413 No vom / nausea. No CP or SOB, cough congestion.  [WF]    Clinical Course User Index [WF] Dec 06, 2021, PA                           Medical Decision Making Amount and/or Complexity of  Data Reviewed Labs: ordered.  Risk Prescription drug management.   This patient presents to the ED for concern of nausea diarrhea abdominal pain, this involves a number of treatment options, and is a complaint that carries with it a moderate risk of complications and morbidity.  The differential diagnosis  includes --in the setting --I think most likely this is viral gastroenteritis.  The differential diagnosis of diarrhea includes but is not limited to Viral- norovirus/rotavirus; Bacterial-Campylobacter,Shigella, Salmonella, Escherichia coli, E. coli 0157:H7, Yersinia enterocolitica, Vibrio cholerae, Clostridium difficile. Parasitic- Giardia lamblia, Cryptosporidium,Entamoeba histolytica,Cyclospora, Microsporidium. Toxin- Staphylococcus aureus, Bacillus cereus. Noninfectious causes include GI Bleed, Appendicitis, Mesenteric Ischemia, Diverticulitis, Adrenal Crisis, Thyroid Storm, Toxicologic exposures, Antibiotic or drug-associated, inflammatory bowel disease.   Co morbidities: Discussed in HPI   Brief History:  Patient is an 36 year old male with a past medical history significant for vertigo asthma bronchitis  Patient presented emergency room today with abdominal pain for proximally 3 days.  He states it is a crampy generalized abdominal pain he states he actually has no pain currently.  He states that he has had diarrhea but no nausea or vomiting.  He denies any fevers, recent antibiotics, recent travel and denies any blood in his stool.  He states he is having 5 episodes of watery diarrhea per day.  Denies any chest pain difficulty breathing cough congestion lightheadedness or dizziness.  No other associate symptoms.  He is taking medications prior to arrival.    EMR reviewed including pt PMHx, past surgical history and past visits to ER.   See HPI for more details   Lab Tests:   I ordered and independently interpreted labs. Labs notable for Mild bilirubin elevation likely  dehydration. No RUQ TTP.   Urinalysis unremarkable.  Small hemoglobin he will have to have this rechecked by PCP.  CBC with erythrocytosis.  He is not a smoker this is likely hemoconcentration/dehydration.  No leukocytosis.  Imaging Studies:  No imaging studies ordered for this patient    Cardiac Monitoring:  NA NA   Medicines ordered:  I ordered medication including Bentyl for diarrhea/cramps Reevaluation of the patient after these medicines showed that the patient stayed the same I have reviewed the patients home medicines and have made adjustments as needed   Critical Interventions:     Consults/Attending Physician      Reevaluation:  After the interventions noted above I re-evaluated patient and found that they have :stayed the same   Social Determinants of Health:      Problem List / ED Course:  Patient with nausea abdominal pain crampy in quality and some diarrhea.  Well-appearing normal vital signs and reassuring work-up here in the ER.  No abdominal tenderness.  I think reasonable to hold off on advanced imaging/additional evaluation and treat conservatively with Bentyl Zofran and recommend hydration at home.  Offered IV hydration which patient declined.  Will discharge at this time.   Dispostion:  After consideration of the diagnostic results and the patients response to treatment, I feel that the patent would benefit from close outpatient follow-up.   Final Clinical Impression(s) / ED Diagnoses Final diagnoses:  Generalized abdominal pain  Diarrhea, unspecified type    Rx / DC Orders ED Discharge Orders          Ordered    ondansetron (ZOFRAN-ODT) 4 MG disintegrating tablet  Every 8 hours PRN        12/04/21 1417    dicyclomine (BENTYL) 20 MG tablet  2 times daily        12/04/21 1417              Solon Augusta [place], [place] 12/04/21 1427    Bethann Berkshire, MD 12/05/21 1721

## 2021-12-04 NOTE — ED Triage Notes (Signed)
Pt to the ED with complaints of abdominal pain for the last 3 days.  Pt endorses N/V/D.

## 2022-02-25 ENCOUNTER — Encounter: Payer: Self-pay | Admitting: Emergency Medicine

## 2022-02-25 ENCOUNTER — Ambulatory Visit
Admission: EM | Admit: 2022-02-25 | Discharge: 2022-02-25 | Disposition: A | Discharge status: Home / Self Care | Payer: 59 | Attending: Family Medicine | Admitting: Family Medicine

## 2022-02-25 DIAGNOSIS — J069 Acute upper respiratory infection, unspecified: Secondary | ICD-10-CM | POA: Insufficient documentation

## 2022-02-25 DIAGNOSIS — Z1152 Encounter for screening for COVID-19: Secondary | ICD-10-CM | POA: Insufficient documentation

## 2022-02-25 DIAGNOSIS — R059 Cough, unspecified: Secondary | ICD-10-CM | POA: Diagnosis not present

## 2022-02-25 DIAGNOSIS — J4521 Mild intermittent asthma with (acute) exacerbation: Secondary | ICD-10-CM | POA: Insufficient documentation

## 2022-02-25 LAB — RESP PANEL BY RT-PCR (FLU A&B, COVID) ARPGX2
Influenza A by PCR: NEGATIVE
Influenza B by PCR: NEGATIVE
SARS Coronavirus 2 by RT PCR: NEGATIVE

## 2022-02-25 MED ORDER — ALBUTEROL SULFATE HFA 108 (90 BASE) MCG/ACT IN AERS: 2.0000 | INHALATION_SPRAY | RESPIRATORY_TRACT | 2 refills | Status: DC | PRN

## 2022-02-25 MED ORDER — PROMETHAZINE-DM 6.25-15 MG/5ML PO SYRP: 5.0000 mL | ORAL_SOLUTION | Freq: Four times a day (QID) | ORAL | 0 refills | Status: DC | PRN

## 2022-02-25 NOTE — ED Provider Notes (Signed)
[facility]    CSN: [id number] Arrival date & time: 02/25/22  1139      History   Chief Complaint No chief complaint on file.   HPI Danny Joyce is a 36 y.o. male.   Presenting today with 1 day history of sneezing, congestion, cough, scratchy throat, wheezing.  Denies fever, chills, chest pain, shortness of breath, abdominal pain, nausea vomiting or diarrhea.  Taking Hall's cough drops with minimal relief.  No known sick contacts recently.  History of asthma not currently with an albuterol inhaler at home.    Past Medical History:  Diagnosis Date   Asthma    Bronchitis    Vertigo     There are no problems to display for this patient.   History reviewed. No pertinent surgical history.     Home Medications    Prior to Admission medications   Medication Sig Start Date End Date Taking? Authorizing Provider  promethazine-dextromethorphan (PROMETHAZINE-DM) 6.25-15 MG/5ML syrup Take 5 mLs by mouth 4 (four) times daily as needed. 02/25/22  Yes Particia Nearing, PA-C  albuterol (VENTOLIN HFA) 108 (90 Base) MCG/ACT inhaler Inhale 2 puffs into the lungs every 4 (four) hours as needed for wheezing or shortness of breath. 02/25/22   Particia Nearing, PA-C  benzonatate (TESSALON) 100 MG capsule Take 1-2 capsules (100-200 mg total) by mouth 3 (three) times daily as needed for cough. 08/17/21   Wallis Bamberg, PA-C  dicyclomine (BENTYL) 20 MG tablet Take 1 tablet (20 mg total) by mouth 2 (two) times daily. 12/04/21   Gailen Shelter, PA  HYDROcodone-acetaminophen (NORCO/VICODIN) 5-325 MG tablet Take 1 tablet by mouth every 6 (six) hours as needed. 05/28/21   Bethann Berkshire, MD  ibuprofen (ADVIL) 800 MG tablet Take 1 tablet (800 mg total) by mouth 3 (three) times daily. 05/28/21   Bethann Berkshire, MD  loratadine-pseudoephedrine (CLARITIN-D 12 HOUR) 5-120 MG tablet Take 1 tablet by mouth 2 (two) times daily. 08/24/17   Ivery Quale, PA-C  meclizine (ANTIVERT) 50 MG tablet  Take 1 tablet (50 mg total) by mouth 3 (three) times daily as needed. Patient not taking: Reported on 05/28/2021 03/14/15   Phillis Haggis, MD  methylPREDNISolone (MEDROL DOSEPAK) 4 MG TBPK tablet Take 24 mg on day 1, 20 mg on day 2, 16 mg on day 3, 12 mg on day 4, 8 mg on day 5, 4 mg on day 6. Patient not taking: Reported on 05/28/2021 03/22/21   Theadora Rama Scales, PA-C  ondansetron (ZOFRAN-ODT) 4 MG disintegrating tablet Take 1 tablet (4 mg total) by mouth every 8 (eight) hours as needed for nausea or vomiting. 12/04/21   Gailen Shelter, PA  predniSONE (DELTASONE) 50 MG tablet Take 1 tablet (50 mg total) by mouth daily with breakfast. 08/17/21   Wallis Bamberg, PA-C  promethazine-dextromethorphan (PROMETHAZINE-DM) 6.25-15 MG/5ML syrup Take 5 mLs by mouth at bedtime as needed for cough. 08/17/21   Wallis Bamberg, PA-C  salicylic acid-lactic acid 17 % external solution Apply topically 2 (two) times daily. Apply twice daily to affected site and let dry completely Patient not taking: Reported on 05/28/2021 08/12/17   Burgess Amor, PA-C  tiZANidine (ZANAFLEX) 2 MG tablet Take 1-2 tablets (2-4 mg total) by mouth every 6 (six) hours as needed for muscle spasms. Patient not taking: Reported on 05/28/2021 02/04/21   Lew Dawes, PA-C    Family History Family History  Problem Relation Age of Onset   Asthma Mother    Asthma  Sister     Social History Social History   Tobacco Use   Smoking status: Some Days    Packs/day: 0.02    Years: 2.00    Total pack years: 0.04    Types: Cigarettes   Smokeless tobacco: Never  Vaping Use   Vaping Use: Never used  Substance Use Topics   Alcohol use: No   Drug use: No     Allergies   Patient has no known allergies.   Review of Systems Review of Systems Per HPI  Physical Exam Triage Vital Signs ED Triage Vitals  Enc Vitals Group     BP 02/25/22 1144 132/83     Pulse Rate 02/25/22 1144 68     Resp 02/25/22 1144 18     Temp 02/25/22 1144 98.7 F  (37.1 C)     Temp Source 02/25/22 1144 Oral     SpO2 02/25/22 1144 98 %     Weight --      Height --      Head Circumference --      Peak Flow --      Pain Score 02/25/22 1146 10     Pain Loc --      Pain Edu? --      Excl. in GC? --    No data found.  Updated Vital Signs BP 132/83 (BP Location: Right Arm)   Pulse 68   Temp 98.7 F (37.1 C) (Oral)   Resp 18   SpO2 98%   Visual Acuity Right Eye Distance:   Left Eye Distance:   Bilateral Distance:    Right Eye Near:   Left Eye Near:    Bilateral Near:     Physical Exam Vitals and nursing note reviewed.  Constitutional:      Appearance: He is well-developed.  HENT:     Head: Atraumatic.     Right Ear: External ear normal.     Left Ear: External ear normal.     Nose: Rhinorrhea present.     Mouth/Throat:     Pharynx: Posterior oropharyngeal erythema present. No oropharyngeal exudate.  Eyes:     Conjunctiva/sclera: Conjunctivae normal.     Pupils: Pupils are equal, round, and reactive to light.  Cardiovascular:     Rate and Rhythm: Normal rate and regular rhythm.  Pulmonary:     Effort: Pulmonary effort is normal. No respiratory distress.     Breath sounds: No wheezing or rales.  Musculoskeletal:        General: Normal range of motion.     Cervical back: Normal range of motion and neck supple.  Lymphadenopathy:     Cervical: No cervical adenopathy.  Skin:    General: Skin is warm and dry.  Neurological:     Mental Status: He is alert and oriented to person, place, and time.  Psychiatric:        Behavior: Behavior normal.      UC Treatments / Results  Labs (all labs ordered are listed, but only abnormal results are displayed) Labs Reviewed  RESP PANEL BY RT-PCR (FLU A&B, COVID) [id number]    EKG   Radiology No results found.  Procedures Procedures (including critical care time)  Medications Ordered in UC Medications - No data to display  Initial Impression / Assessment and Plan / UC Course   I have reviewed the triage vital signs and the nursing notes.  Pertinent labs & imaging results that were available during my care of the patient were  reviewed by me and considered in my medical decision making (see chart for details).     Vitals and exam overall reassuring and suggestive of a viral upper respiratory infection with asthma exacerbation.  Respiratory panel pending, treat with albuterol inhaler, Phenergan DM and supportive over-the-counter medications and home care.  Work note given.  Return for worsening symptoms.  Final Clinical Impressions(s) / UC Diagnoses   Final diagnoses:  Viral URI with cough  Mild intermittent asthma with acute exacerbation   Discharge Instructions   None    ED Prescriptions     Medication Sig Dispense Auth. Provider   albuterol (VENTOLIN HFA) 108 (90 Base) MCG/ACT inhaler Inhale 2 puffs into the lungs every 4 (four) hours as needed for wheezing or shortness of breath. 1 each Volney American, PA-C   promethazine-dextromethorphan (PROMETHAZINE-DM) 6.25-15 MG/5ML syrup Take 5 mLs by mouth 4 (four) times daily as needed. 100 mL Volney American, [place]      PDMP not reviewed this encounter.   Volney American, [place] 02/25/22 1214

## 2022-02-25 NOTE — ED Triage Notes (Signed)
Sore throat and nasal congestion that started today.  Sneezing yesterday.

## 2022-03-17 ENCOUNTER — Ambulatory Visit
Admission: EM | Admit: 2022-03-17 | Discharge: 2022-03-17 | Disposition: A | Discharge status: Home / Self Care | Payer: 59 | Attending: Family Medicine | Admitting: Family Medicine

## 2022-03-17 ENCOUNTER — Other Ambulatory Visit: Payer: Self-pay

## 2022-03-17 ENCOUNTER — Encounter: Payer: Self-pay | Admitting: Emergency Medicine

## 2022-03-17 DIAGNOSIS — M25571 Pain in right ankle and joints of right foot: Secondary | ICD-10-CM | POA: Diagnosis not present

## 2022-03-17 MED ORDER — NAPROXEN 500 MG PO TABS: 500.0000 mg | ORAL_TABLET | Freq: Two times a day (BID) | ORAL | 0 refills | Status: DC | PRN

## 2022-03-17 NOTE — ED Triage Notes (Addendum)
Patient reports unloading furniture .  A piece of furniture landed on right leg, but reports pain at right ankle.  Patient is able to wiggle/rotate right ankle.  Reports blow to right leg, knocked him to the floor of truck.  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  No readily visible injury to right leg, knee, or ankle

## 2022-03-17 NOTE — Discharge Instructions (Addendum)
I recommend ice, elevation, compression, rest.  I have sent over an anti-inflammatory pain medication to help additionally.

## 2022-03-17 NOTE — ED Provider Notes (Signed)
[facility]    CSN: [id number] Arrival date & time: 03/17/22  1845      History   Chief Complaint No chief complaint on file.   HPI Danny Joyce is a 36 y.o. male.   Patient presenting today with right lower leg and anterior ankle pain after a large and thick heavy piece of glass was dropped onto the leg today.  He denies skin injury, bruising, swelling, decreased range of motion, numbness, tingling.  So far has not tried anything over-the-counter for symptoms.    Past Medical History:  Diagnosis Date   Asthma    Bronchitis    Vertigo     There are no problems to display for this patient.   History reviewed. No pertinent surgical history.     Home Medications    Prior to Admission medications   Medication Sig Start Date End Date Taking? Authorizing Provider  ibuprofen (ADVIL) 800 MG tablet Take 1 tablet (800 mg total) by mouth 3 (three) times daily. 05/28/21  Yes Milton Ferguson, MD  naproxen (NAPROSYN) 500 MG tablet Take 1 tablet (500 mg total) by mouth 2 (two) times daily as needed. 03/17/22  Yes Volney American, PA-C  albuterol (VENTOLIN HFA) 108 (90 Base) MCG/ACT inhaler Inhale 2 puffs into the lungs every 4 (four) hours as needed for wheezing or shortness of breath. Patient not taking: Reported on 03/17/2022 02/25/22   Volney American, PA-C  benzonatate (TESSALON) 100 MG capsule Take 1-2 capsules (100-200 mg total) by mouth 3 (three) times daily as needed for cough. Patient not taking: Reported on 03/17/2022 08/17/21   Jaynee Eagles, PA-C  dicyclomine (BENTYL) 20 MG tablet Take 1 tablet (20 mg total) by mouth 2 (two) times daily. Patient not taking: Reported on 03/17/2022 12/04/21   Tedd Sias, PA  HYDROcodone-acetaminophen (NORCO/VICODIN) 5-325 MG tablet Take 1 tablet by mouth every 6 (six) hours as needed. Patient not taking: Reported on 03/17/2022 05/28/21   Milton Ferguson, MD  loratadine-pseudoephedrine (CLARITIN-D 12 HOUR) 5-120 MG  tablet Take 1 tablet by mouth 2 (two) times daily. Patient not taking: Reported on 03/17/2022 08/24/17   Lily Kocher, PA-C  meclizine (ANTIVERT) 50 MG tablet Take 1 tablet (50 mg total) by mouth 3 (three) times daily as needed. Patient not taking: Reported on 05/28/2021 03/14/15   Pixie Casino, MD  methylPREDNISolone (MEDROL DOSEPAK) 4 MG TBPK tablet Take 24 mg on day 1, 20 mg on day 2, 16 mg on day 3, 12 mg on day 4, 8 mg on day 5, 4 mg on day 6. Patient not taking: Reported on 05/28/2021 03/22/21   Lynden Oxford Scales, PA-C  ondansetron (ZOFRAN-ODT) 4 MG disintegrating tablet Take 1 tablet (4 mg total) by mouth every 8 (eight) hours as needed for nausea or vomiting. Patient not taking: Reported on 03/17/2022 12/04/21   Tedd Sias, PA  predniSONE (DELTASONE) 50 MG tablet Take 1 tablet (50 mg total) by mouth daily with breakfast. Patient not taking: Reported on 03/17/2022 08/17/21   Jaynee Eagles, PA-C  promethazine-dextromethorphan (PROMETHAZINE-DM) 6.25-15 MG/5ML syrup Take 5 mLs by mouth at bedtime as needed for cough. Patient not taking: Reported on 03/17/2022 08/17/21   Jaynee Eagles, PA-C  promethazine-dextromethorphan (PROMETHAZINE-DM) 6.25-15 MG/5ML syrup Take 5 mLs by mouth 4 (four) times daily as needed. Patient not taking: Reported on 03/17/2022 02/25/22   Volney American, PA-C  salicylic acid-lactic acid 17 % external solution Apply topically 2 (two) times daily. Apply twice daily  to affected site and let dry completely Patient not taking: Reported on 05/28/2021 08/12/17   Burgess Amor, PA-C  tiZANidine (ZANAFLEX) 2 MG tablet Take 1-2 tablets (2-4 mg total) by mouth every 6 (six) hours as needed for muscle spasms. Patient not taking: Reported on 05/28/2021 02/04/21   Lew Dawes, PA-C    Family History Family History  Problem Relation Age of Onset   Asthma Mother    Asthma Sister     Social History Social History   Tobacco Use   Smoking status: Some Days     Packs/day: 0.02    Years: 2.00    Total pack years: 0.04    Types: Cigarettes   Smokeless tobacco: Never  Vaping Use   Vaping Use: Never used  Substance Use Topics   Alcohol use: No   Drug use: No     Allergies   Patient has no known allergies.   Review of Systems Review of Systems Per HPI  Physical Exam Triage Vital Signs ED Triage Vitals  Enc Vitals Group     BP 03/17/22 1919 (!) 115/52     Pulse Rate 03/17/22 1919 70     Resp 03/17/22 1919 18     Temp 03/17/22 1919 98.5 F (36.9 C)     Temp Source 03/17/22 1919 Oral     SpO2 03/17/22 1919 95 %     Weight --      Height --      Head Circumference --      Peak Flow --      Pain Score 03/17/22 1914 10     Pain Loc --      Pain Edu? --      Excl. in GC? --    No data found.  Updated Vital Signs BP (!) 115/52 (BP Location: Right Arm) Comment (BP Location): large cuff  Pulse 70   Temp 98.5 F (36.9 C) (Oral)   Resp 18   SpO2 95%   Visual Acuity Right Eye Distance:   Left Eye Distance:   Bilateral Distance:    Right Eye Near:   Left Eye Near:    Bilateral Near:     Physical Exam Vitals and nursing note reviewed.  Constitutional:      Appearance: Normal appearance.  HENT:     Head: Atraumatic.  Eyes:     Extraocular Movements: Extraocular movements intact.     Conjunctiva/sclera: Conjunctivae normal.  Cardiovascular:     Rate and Rhythm: Normal rate.  Pulmonary:     Effort: Pulmonary effort is normal.  Musculoskeletal:        General: Tenderness and signs of injury present. No swelling or deformity. Normal range of motion.     Cervical back: Normal range of motion.     Comments: Mild tenderness to palpation right anterior ankle, no bony deformity palpable and range of motion, joint stability full and intact  Skin:    General: Skin is warm and dry.     Findings: No bruising or erythema.  Neurological:     Comments: Right lower extremity neurovascularly intact  Psychiatric:        Mood and  Affect: Mood normal.        Thought Content: Thought content normal.        Judgment: Judgment normal.      UC Treatments / Results  Labs (all labs ordered are listed, but only abnormal results are displayed) Labs Reviewed - No data to display  EKG  Radiology No results found.  Procedures Procedures (including critical care time)  Medications Ordered in UC Medications - No data to display  Initial Impression / Assessment and Plan / UC Course  I have reviewed the triage vital signs and the nursing notes.  Pertinent labs & imaging results that were available during my care of the patient were reviewed by me and considered in my medical decision making (see chart for details).     X-ray imaging deferred today with shared decision making, very low suspicion for bony injury.  Suspect contusion to the ankle from the heavy object hitting it.  Discussed RICE protocol, naproxen, Tylenol, rest.  Work note given.  Return for worsening symptoms.  Final Clinical Impressions(s) / UC Diagnoses   Final diagnoses:  Acute right ankle pain     Discharge Instructions      I recommend ice, elevation, compression, rest.  I have sent over an anti-inflammatory pain medication to help additionally.    ED Prescriptions     Medication Sig Dispense Auth. Provider   naproxen (NAPROSYN) 500 MG tablet Take 1 tablet (500 mg total) by mouth 2 (two) times daily as needed. 30 tablet Particia Nearing, [place]      PDMP not reviewed this encounter.   Particia Nearing, [place] 03/17/22 1950

## 2022-09-16 ENCOUNTER — Ambulatory Visit
Admission: EM | Admit: 2022-09-16 | Discharge: 2022-09-16 | Disposition: A | Discharge status: Home / Self Care | Payer: 59 | Attending: Nurse Practitioner | Admitting: Nurse Practitioner

## 2022-09-16 DIAGNOSIS — M25512 Pain in left shoulder: Secondary | ICD-10-CM

## 2022-09-16 MED ORDER — DEXAMETHASONE SODIUM PHOSPHATE 10 MG/ML IJ SOLN
10.0000 mg | INTRAMUSCULAR | Status: AC
  Administered 2022-09-16: 10 mg via INTRAMUSCULAR

## 2022-09-16 MED ORDER — METHOCARBAMOL 500 MG PO TABS: 500.0000 mg | ORAL_TABLET | Freq: Two times a day (BID) | ORAL | 0 refills | Status: DC

## 2022-09-16 MED ORDER — KETOROLAC TROMETHAMINE 30 MG/ML IJ SOLN
30.0000 mg | Freq: Once | INTRAMUSCULAR | Status: AC
  Administered 2022-09-16: 30 mg via INTRAMUSCULAR

## 2022-09-16 MED ORDER — PREDNISONE 20 MG PO TABS: 40.0000 mg | ORAL_TABLET | Freq: Every day | ORAL | 0 refills | Status: AC

## 2022-09-16 NOTE — ED Triage Notes (Signed)
Pt c/o left shoulder pain, noticed muscle pain after work yesterday. And it has not gotten better. Pain is from shoulder to elbow.

## 2022-09-16 NOTE — ED Provider Notes (Signed)
[facility]    CSN: [id number] Arrival date & time: 09/16/22  1314      History   Chief Complaint No chief complaint on file.   HPI Danny Joyce is a 37 y.o. male.   The history is provided by the patient.   The patient presents for complaints of left shoulder pain that started after he got off work yesterday.  He states "tweaked" my shoulder".  He states that he works for [employer] all day.  He does not indicate any specific injury or traumatic event.  Patient states normally when he gets off work, and has a hot shower, his pain improves.  He states that this morning, he continued to experience pain in the left shoulder.  He states that the pain is both "sharp" and throbbing"  He states that the pain starts from his left chest/shoulder, and extends to the left elbow.  Patient denies numbness, tingling, or neck pain.  He denies any previous injury or trauma to the left shoulder.  He states that when he was in the shower, his pain does improve, but he is not taking any medication.  Past Medical History:  Diagnosis Date   Asthma    Bronchitis    Vertigo     There are no problems to display for this patient.   History reviewed. No pertinent surgical history.     Home Medications    Prior to Admission medications   Medication Sig Start Date End Date Taking? Authorizing Provider  methocarbamol (ROBAXIN) 500 MG tablet Take 1 tablet (500 mg total) by mouth 2 (two) times daily. 09/16/22  Yes Saki Legore-Warren, Sadie Haber, NP  predniSONE (DELTASONE) 20 MG tablet Take 2 tablets (40 mg total) by mouth daily with breakfast for 5 days. 09/16/22 09/21/22 Yes Roise Emert-Warren, Sadie Haber, NP  albuterol (VENTOLIN HFA) 108 (90 Base) MCG/ACT inhaler Inhale 2 puffs into the lungs every 4 (four) hours as needed for wheezing or shortness of breath. Patient not taking: Reported on 03/17/2022 02/25/22   Particia Nearing, PA-C  benzonatate (TESSALON) 100 MG capsule  Take 1-2 capsules (100-200 mg total) by mouth 3 (three) times daily as needed for cough. Patient not taking: Reported on 03/17/2022 08/17/21   Wallis Bamberg, PA-C  dicyclomine (BENTYL) 20 MG tablet Take 1 tablet (20 mg total) by mouth 2 (two) times daily. Patient not taking: Reported on 03/17/2022 12/04/21   Gailen Shelter, PA  HYDROcodone-acetaminophen (NORCO/VICODIN) 5-325 MG tablet Take 1 tablet by mouth every 6 (six) hours as needed. Patient not taking: Reported on 03/17/2022 05/28/21   Bethann Berkshire, MD  ibuprofen (ADVIL) 800 MG tablet Take 1 tablet (800 mg total) by mouth 3 (three) times daily. 05/28/21   Bethann Berkshire, MD  loratadine-pseudoephedrine (CLARITIN-D 12 HOUR) 5-120 MG tablet Take 1 tablet by mouth 2 (two) times daily. Patient not taking: Reported on 03/17/2022 08/24/17   Ivery Quale, PA-C  meclizine (ANTIVERT) 50 MG tablet Take 1 tablet (50 mg total) by mouth 3 (three) times daily as needed. Patient not taking: Reported on 05/28/2021 03/14/15   Phillis Haggis, MD  methylPREDNISolone (MEDROL DOSEPAK) 4 MG TBPK tablet Take 24 mg on day 1, 20 mg on day 2, 16 mg on day 3, 12 mg on day 4, 8 mg on day 5, 4 mg on day 6. Patient not taking: Reported on 05/28/2021 03/22/21   Theadora Rama Scales, PA-C  naproxen (NAPROSYN) 500 MG tablet Take 1 tablet (500 mg total)  by mouth 2 (two) times daily as needed. 03/17/22   Particia Nearing, PA-C  ondansetron (ZOFRAN-ODT) 4 MG disintegrating tablet Take 1 tablet (4 mg total) by mouth every 8 (eight) hours as needed for nausea or vomiting. Patient not taking: Reported on 03/17/2022 12/04/21   Gailen Shelter, PA  promethazine-dextromethorphan (PROMETHAZINE-DM) 6.25-15 MG/5ML syrup Take 5 mLs by mouth at bedtime as needed for cough. Patient not taking: Reported on 03/17/2022 08/17/21   Wallis Bamberg, PA-C  promethazine-dextromethorphan (PROMETHAZINE-DM) 6.25-15 MG/5ML syrup Take 5 mLs by mouth 4 (four) times daily as needed. Patient not taking:  Reported on 03/17/2022 02/25/22   Particia Nearing, PA-C  salicylic acid-lactic acid 17 % external solution Apply topically 2 (two) times daily. Apply twice daily to affected site and let dry completely Patient not taking: Reported on 05/28/2021 08/12/17   Burgess Amor, PA-C  tiZANidine (ZANAFLEX) 2 MG tablet Take 1-2 tablets (2-4 mg total) by mouth every 6 (six) hours as needed for muscle spasms. Patient not taking: Reported on 05/28/2021 02/04/21   Lew Dawes, PA-C    Family History Family History  Problem Relation Age of Onset   Asthma Mother    Asthma Sister     Social History Social History   Tobacco Use   Smoking status: Some Days    Packs/day: 0.02    Years: 2.00    Additional pack years: 0.00    Total pack years: 0.04    Types: Cigarettes   Smokeless tobacco: Never  Vaping Use   Vaping Use: Never used  Substance Use Topics   Alcohol use: No   Drug use: No     Allergies   Patient has no known allergies.   Review of Systems Review of Systems Per HPI  Physical Exam Triage Vital Signs ED Triage Vitals  Enc Vitals Group     BP 09/16/22 1426 134/71     Pulse Rate 09/16/22 1426 66     Resp 09/16/22 1426 18     Temp 09/16/22 1426 97.8 F (36.6 C)     Temp Source 09/16/22 1426 Oral     SpO2 09/16/22 1426 98 %     Weight --      Height --      Head Circumference --      Peak Flow --      Pain Score 09/16/22 1430 10     Pain Loc --      Pain Edu? --      Excl. in GC? --    No data found.  Updated Vital Signs BP 134/71 (BP Location: Right Arm)   Pulse 66   Temp 97.8 F (36.6 C) (Oral)   Resp 18   SpO2 98%   Visual Acuity Right Eye Distance:   Left Eye Distance:   Bilateral Distance:    Right Eye Near:   Left Eye Near:    Bilateral Near:     Physical Exam Vitals and nursing note reviewed.  Constitutional:      General: He is not in acute distress.    Appearance: Normal appearance.  Eyes:     Extraocular Movements: Extraocular  movements intact.     Pupils: Pupils are equal, round, and reactive to light.  Pulmonary:     Effort: Pulmonary effort is normal.  Musculoskeletal:     Left shoulder: Tenderness present. No swelling or deformity. Normal range of motion.     Cervical back: Normal range of motion.  Comments: Tenderness noted to the anterior shoulder/chest.  Lymphadenopathy:     Cervical: No cervical adenopathy.  Skin:    General: Skin is warm and dry.  Neurological:     General: No focal deficit present.     Mental Status: He is alert and oriented to person, place, and time.  Psychiatric:        Mood and Affect: Mood normal.        Behavior: Behavior normal.      UC Treatments / Results  Labs (all labs ordered are listed, but only abnormal results are displayed) Labs Reviewed - No data to display  EKG   Radiology No results found.  Procedures Procedures (including critical care time)  Medications Ordered in UC Medications  dexamethasone (DECADRON) injection 10 mg (has no administration in time range)  ketorolac (TORADOL) 30 MG/ML injection 30 mg (has no administration in time range)    Initial Impression / Assessment and Plan / UC Course  I have reviewed the triage vital signs and the nursing notes.  Pertinent labs & imaging results that were available during my care of the patient were reviewed by me and considered in my medical decision making (see chart for details).  The patient is well-appearing, he is in no acute distress, vital signs are stable.  Patient with left shoulder pain that started over the past 24 hours.  On exam, he has no obvious deformity, ecchymosis, or swelling present.  He also has good range of motion.  He does have tenderness to the anterior shoulder/left chest region.  Imaging was deferred as patient has not had any trauma to the left shoulder.  Patient was given Decadron 10 mg IM and Toradol 30 mg IM to help with pain and inflammation.  Patient was also  started on prednisone 40 mg for the next 5 days.  He also complains of stiffness in the left shoulder.  Patient was provided methocarbamol 500 mg for muscle stiffness and tightness.  Supportive care recommendations were provided and discussed with the patient to include the use of ice or heat, gentle range of motion instruction exercises, and remaining active.  Patient advised that if symptoms do not improve with this treatment, recommend that he follow-up with orthopedics for further evaluation.  Patient is in agreement with this plan of care and verbalizes understanding.  All questions were answered.  Patient stable for discharge.  Work note was provided.   Final Clinical Impressions(s) / UC Diagnoses   Final diagnoses:  Left shoulder pain, unspecified chronicity     Discharge Instructions      Take medication as prescribed. Increase fluids and allow for plenty of rest. May take over-the-counter Tylenol arthritis strength 650 mg tablets as needed for left shoulder pain or discomfort. Apply ice or heat as needed.  Apply ice for pain or swelling, heat for spasm or stiffness.  Apply for 20 minutes, remove for 1 hour, then repeat as needed. Gentle stretching and range of motion exercises at least 3-4 times a day while symptoms persist. If symptoms do not improve with this treatment, recommend following up with orthopedics for further evaluation. Follow-up as needed.     ED Prescriptions     Medication Sig Dispense Auth. Provider   predniSONE (DELTASONE) 20 MG tablet Take 2 tablets (40 mg total) by mouth daily with breakfast for 5 days. 10 tablet Carolyn Sylvia-Warren, Sadie Haber, NP   methocarbamol (ROBAXIN) 500 MG tablet Take 1 tablet (500 mg total) by mouth 2 (two) times  daily. 20 tablet Bayden Gil-Warren, Sadie Haber, NP      PDMP not reviewed this encounter.   Abran Cantor, NP 09/16/22 [phone]

## 2022-09-16 NOTE — Discharge Instructions (Signed)
Take medication as prescribed. Increase fluids and allow for plenty of rest. May take over-the-counter Tylenol arthritis strength 650 mg tablets as needed for left shoulder pain or discomfort. Apply ice or heat as needed.  Apply ice for pain or swelling, heat for spasm or stiffness.  Apply for 20 minutes, remove for 1 hour, then repeat as needed. Gentle stretching and range of motion exercises at least 3-4 times a day while symptoms persist. If symptoms do not improve with this treatment, recommend following up with orthopedics for further evaluation. Follow-up as needed.

## 2022-11-23 ENCOUNTER — Encounter: Payer: Self-pay | Admitting: Emergency Medicine

## 2022-11-23 ENCOUNTER — Ambulatory Visit
Admission: EM | Admit: 2022-11-23 | Discharge: 2022-11-23 | Disposition: A | Discharge status: Home / Self Care | Payer: 59 | Attending: Family Medicine | Admitting: Family Medicine

## 2022-11-23 DIAGNOSIS — R112 Nausea with vomiting, unspecified: Secondary | ICD-10-CM

## 2022-11-23 MED ORDER — ONDANSETRON 4 MG PO TBDP
4.0000 mg | ORAL_TABLET | Freq: Once | ORAL | Status: AC
  Administered 2022-11-23: 4 mg via ORAL

## 2022-11-23 NOTE — ED Provider Notes (Signed)
[facility]    CSN: [id number] Arrival date & time: 11/23/22  1307      History   Chief Complaint No chief complaint on file.   HPI Danny Joyce is a 38 y.o. male.   Presenting today with new onset nausea, vomiting that occurred this morning after drinking a protein shake.  States he vomited 3 times and is having some nausea and diarrhea.  Denies fever, chills, hematemesis, melena, upper respiratory symptoms.  So far not tried anything over-the-counter for symptoms.  Overall is feeling better other than some lingering nausea currently.    Past Medical History:  Diagnosis Date   Asthma    Bronchitis    Vertigo     There are no problems to display for this patient.   History reviewed. No pertinent surgical history.     Home Medications    Prior to Admission medications   Medication Sig Start Date End Date Taking? Authorizing Provider  ibuprofen (ADVIL) 800 MG tablet Take 1 tablet (800 mg total) by mouth 3 (three) times daily. 05/28/21   Bethann Berkshire, MD    Family History Family History  Problem Relation Age of Onset   Asthma Mother    Asthma Sister     Social History Social History   Tobacco Use   Smoking status: Some Days    Packs/day: 0.02    Years: 2.00    Additional pack years: 0.00    Total pack years: 0.04    Types: Cigarettes   Smokeless tobacco: Never  Vaping Use   Vaping Use: Never used  Substance Use Topics   Alcohol use: No   Drug use: No     Allergies   Patient has no known allergies.   Review of Systems Review of Systems Per HPI  Physical Exam Triage Vital Signs ED Triage Vitals  Enc Vitals Group     BP 11/23/22 1339 137/83     Pulse Rate 11/23/22 1339 62     Resp 11/23/22 1339 18     Temp 11/23/22 1339 98.3 F (36.8 C)     Temp Source 11/23/22 1339 Oral     SpO2 11/23/22 1339 98 %     Weight --      Height --      Head Circumference --      Peak Flow --      Pain Score 11/23/22 1341 10     Pain Loc --       Pain Edu? --      Excl. in GC? --    No data found.  Updated Vital Signs BP 137/83 (BP Location: Right Arm)   Pulse 62   Temp 98.3 F (36.8 C) (Oral)   Resp 18   SpO2 98%   Visual Acuity Right Eye Distance:   Left Eye Distance:   Bilateral Distance:    Right Eye Near:   Left Eye Near:    Bilateral Near:     Physical Exam Vitals and nursing note reviewed.  Constitutional:      Appearance: Normal appearance.  HENT:     Head: Atraumatic.     Mouth/Throat:     Mouth: Mucous membranes are moist.     Pharynx: Oropharynx is clear. No posterior oropharyngeal erythema.  Eyes:     Extraocular Movements: Extraocular movements intact.     Conjunctiva/sclera: Conjunctivae normal.  Cardiovascular:     Rate and Rhythm: Normal rate and regular rhythm.  Pulmonary:  Effort: Pulmonary effort is normal.     Breath sounds: Normal breath sounds.  Abdominal:     General: Bowel sounds are normal. There is no distension.     Palpations: Abdomen is soft.     Tenderness: There is no abdominal tenderness. There is no right CVA tenderness, left CVA tenderness or guarding.  Musculoskeletal:        General: Normal range of motion.     Cervical back: Normal range of motion and neck supple.  Skin:    General: Skin is warm and dry.  Neurological:     General: No focal deficit present.     Mental Status: He is oriented to person, place, and time.  Psychiatric:        Mood and Affect: Mood normal.        Thought Content: Thought content normal.        Judgment: Judgment normal.      UC Treatments / Results  Labs (all labs ordered are listed, but only abnormal results are displayed) Labs Reviewed - No data to display  EKG   Radiology No results found.  Procedures Procedures (including critical care time)  Medications Ordered in UC Medications  ondansetron (ZOFRAN-ODT) disintegrating tablet 4 mg (4 mg Oral Given 11/23/22 1405)    Initial Impression / Assessment and Plan  / UC Course  I have reviewed the triage vital signs and the nursing notes.  Pertinent labs & imaging results that were available during my care of the patient were reviewed by me and considered in my medical decision making (see chart for details).     Consistent with food intolerance to the protein shake, Zofran given in clinic to settle his stomach and discussed brat diet, fluids and rest.  Work note given.  Return for any worsening symptoms.  Final Clinical Impressions(s) / UC Diagnoses   Final diagnoses:  Nausea and vomiting, unspecified vomiting type   Discharge Instructions   None    ED Prescriptions   None    PDMP not reviewed this encounter.   Particia Nearing, [place] 11/23/22 1406

## 2022-11-23 NOTE — ED Triage Notes (Signed)
Drunk a protein shake this morning and started to feel nauseated.  States he vomited 3 times and is having diarrhea.  States he needs a work note.

## 2023-12-10 IMAGING — DX DG CHEST 2V
2 series · 2 of 2 positions shown · non-contrast
Comparison: 07/01/2019

CLINICAL DATA: Left-sided chest pain for 2 days, tobacco abuse

EXAM:
CHEST - 2 VIEW

[chest pa]
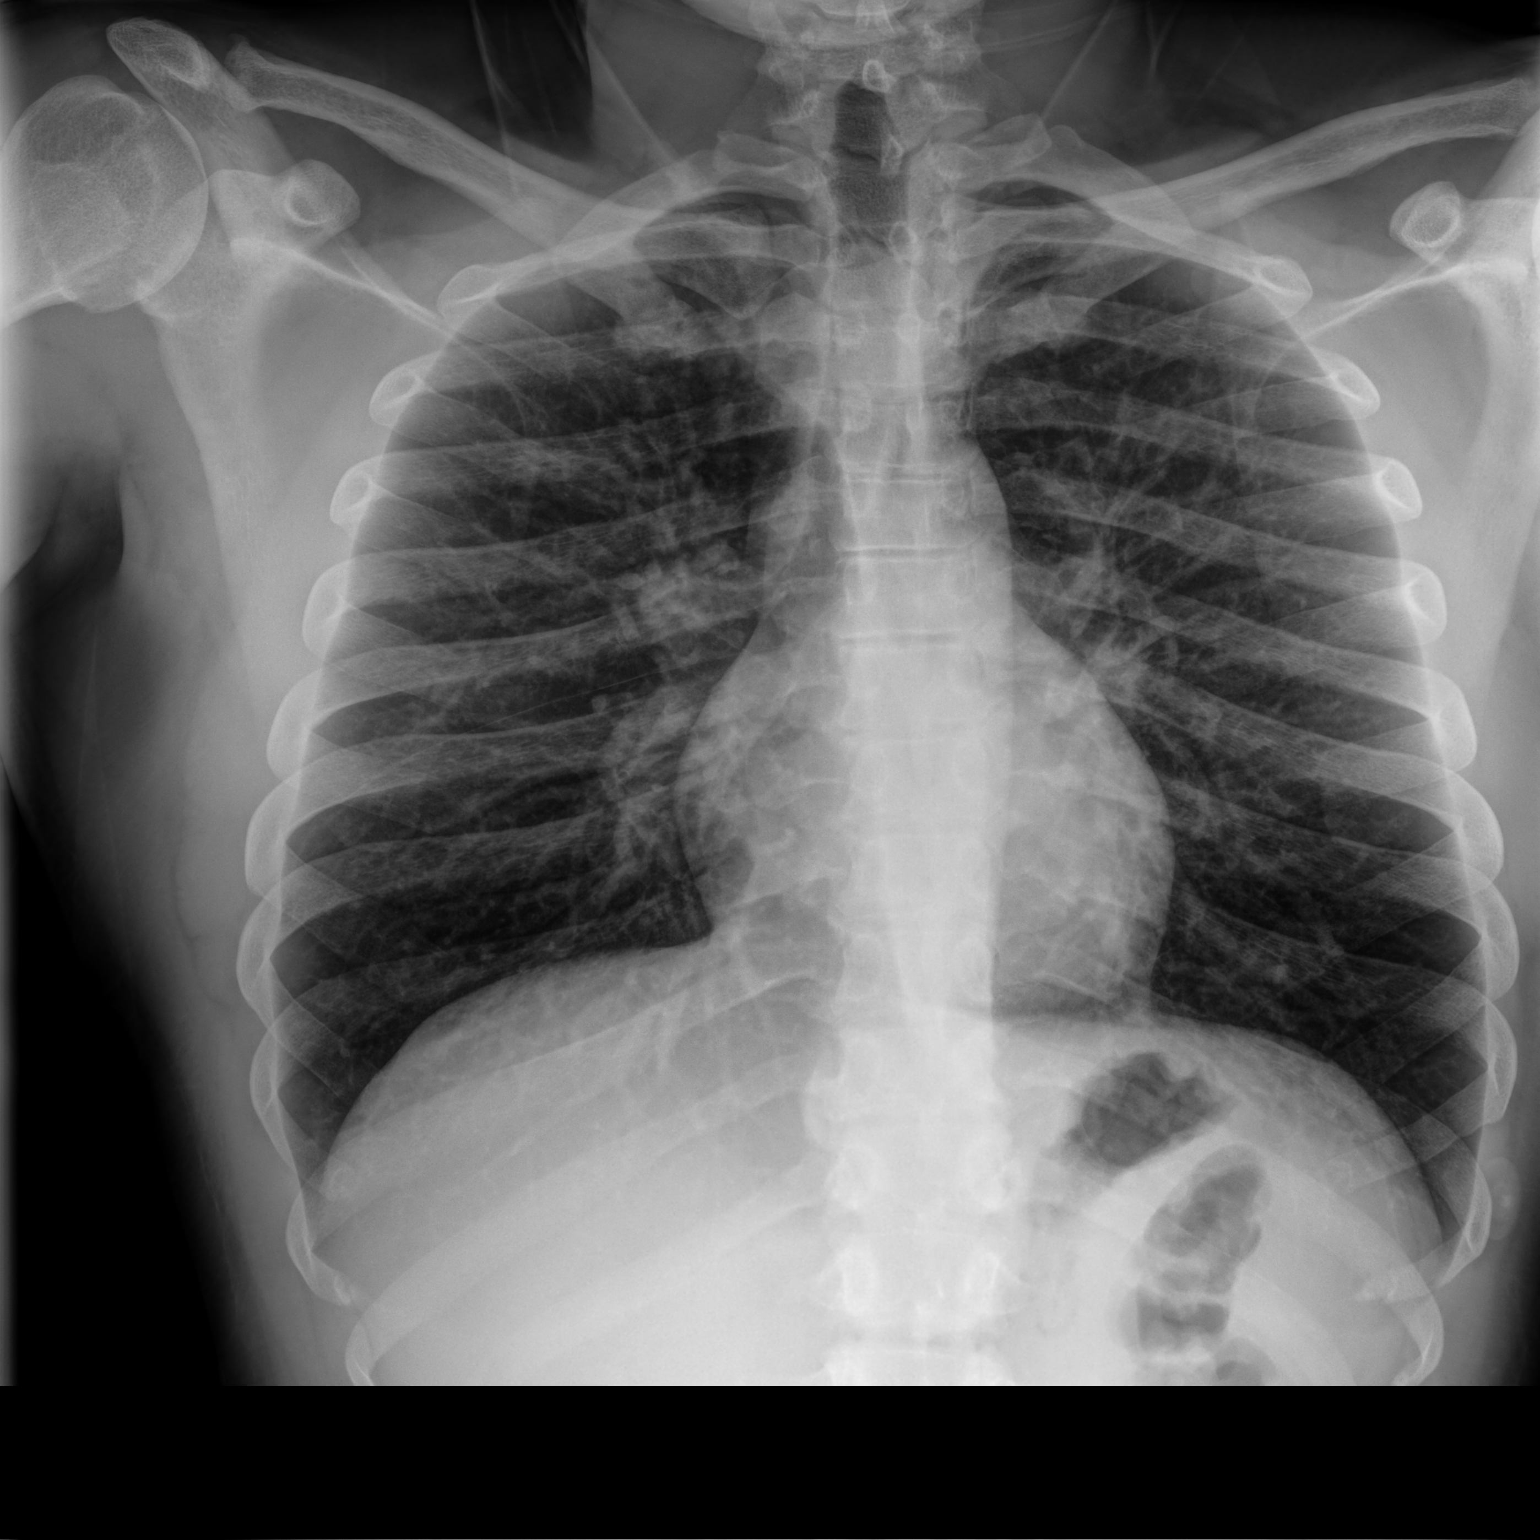

[chest lat]
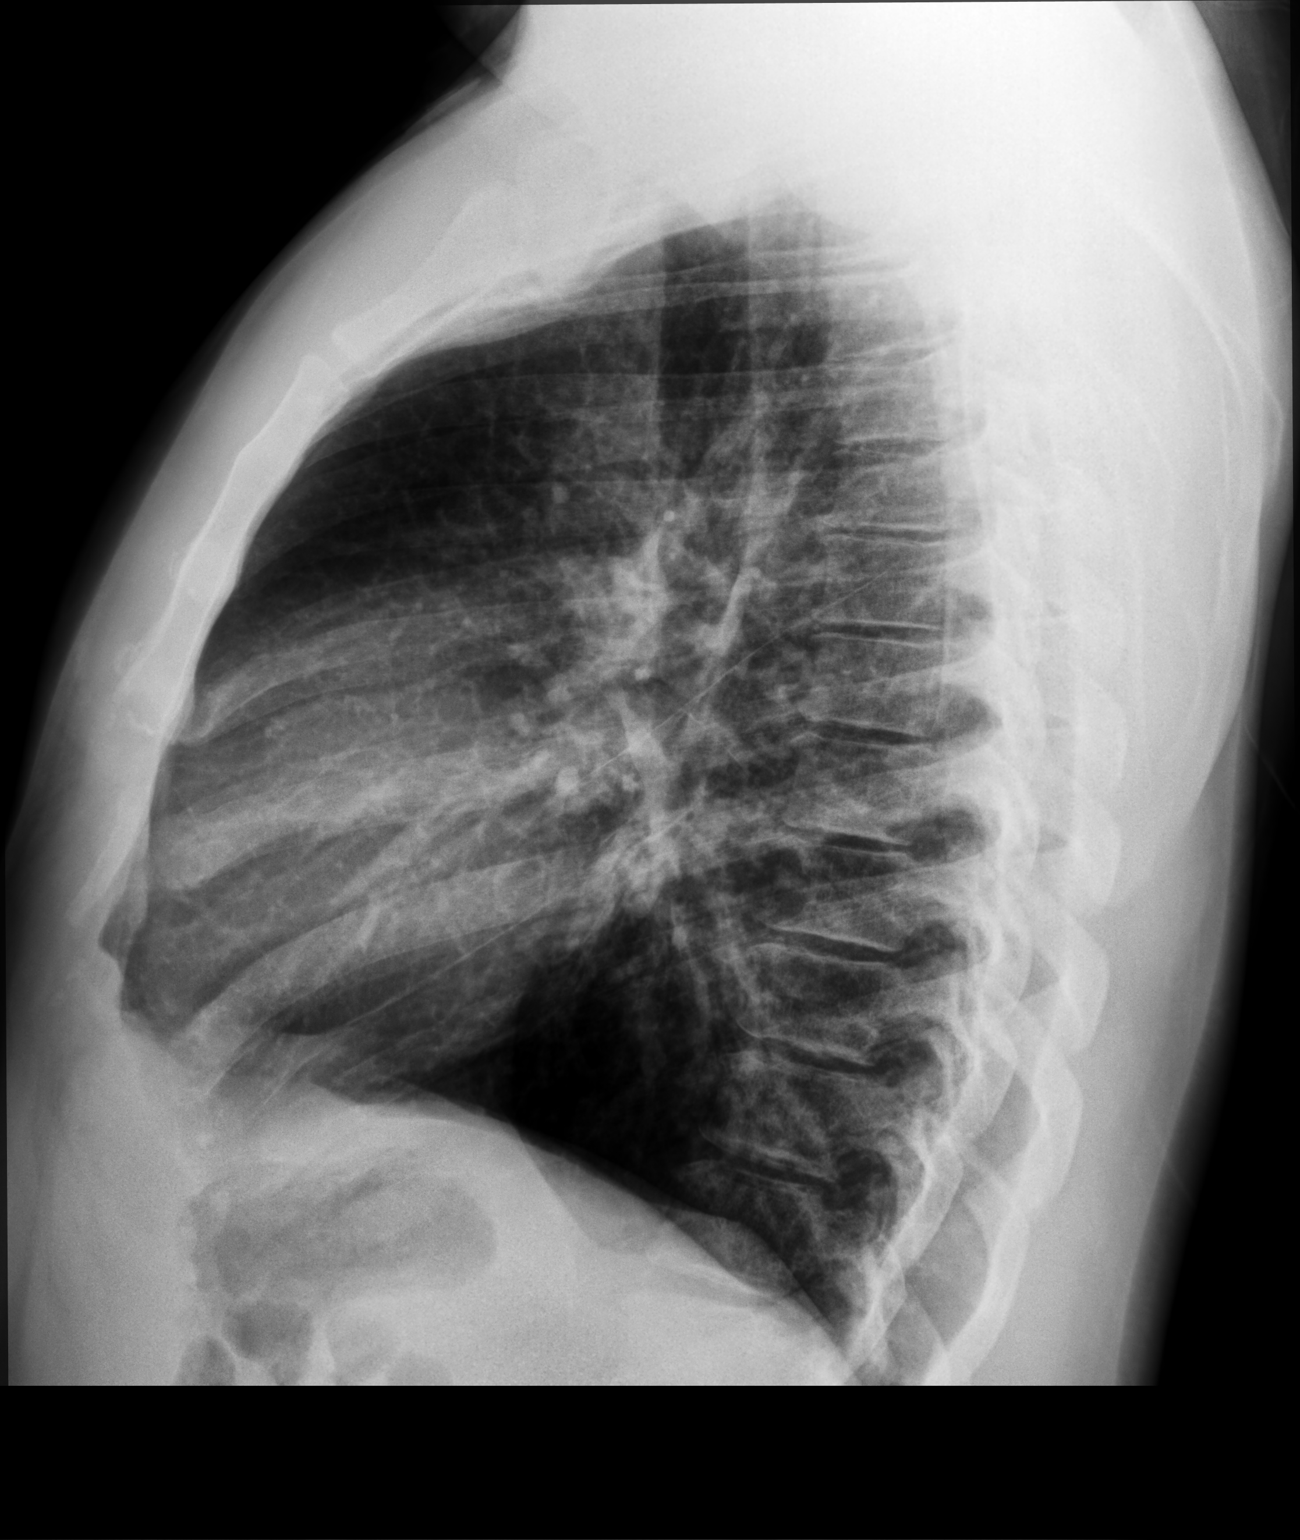

[2 of 2 positions shown; findings below may reference images not displayed]

FINDINGS: Frontal and lateral views of the chest demonstrate an unremarkable
cardiac silhouette. No acute airspace disease, effusion, or
pneumothorax. No acute bony abnormality.
IMPRESSION: 1. No acute intrathoracic process.

## 2024-06-08 ENCOUNTER — Emergency Department (HOSPITAL_COMMUNITY): Admission: EM | Admit: 2024-06-08 | Discharge: 2024-06-08 | Disposition: A | Discharge status: Home / Self Care

## 2024-06-08 ENCOUNTER — Emergency Department (HOSPITAL_COMMUNITY)

## 2024-06-08 DIAGNOSIS — S0990XA Unspecified injury of head, initial encounter: Secondary | ICD-10-CM | POA: Insufficient documentation

## 2024-06-08 DIAGNOSIS — Z23 Encounter for immunization: Secondary | ICD-10-CM | POA: Insufficient documentation

## 2024-06-08 DIAGNOSIS — S6992XA Unspecified injury of left wrist, hand and finger(s), initial encounter: Secondary | ICD-10-CM | POA: Diagnosis present

## 2024-06-08 DIAGNOSIS — S61217A Laceration without foreign body of left little finger without damage to nail, initial encounter: Secondary | ICD-10-CM | POA: Diagnosis not present

## 2024-06-08 DIAGNOSIS — S61218A Laceration without foreign body of other finger without damage to nail, initial encounter: Secondary | ICD-10-CM

## 2024-06-08 MED ORDER — BACITRACIN ZINC 500 UNIT/GM EX OINT
TOPICAL_OINTMENT | CUTANEOUS | Status: AC
  Administered 2024-06-08: 1 via TOPICAL
  Filled 2024-06-08: qty 0.9

## 2024-06-08 MED ORDER — POVIDONE-IODINE 10 % EX SOLN
CUTANEOUS | Status: AC
  Filled 2024-06-08: qty 14.8

## 2024-06-08 MED ORDER — LIDOCAINE HCL (PF) 1 % IJ SOLN
10.0000 mL | Freq: Once | INTRAMUSCULAR | Status: AC
  Administered 2024-06-08: 10 mL via INTRADERMAL
  Filled 2024-06-08: qty 10

## 2024-06-08 MED ORDER — TETANUS-DIPHTH-ACELL PERTUSSIS 5-2-15.5 LF-MCG/0.5 IM SUSP
0.5000 mL | Freq: Once | INTRAMUSCULAR | Status: AC
  Administered 2024-06-08: 0.5 mL via INTRAMUSCULAR
  Filled 2024-06-08: qty 0.5

## 2024-06-08 NOTE — Discharge Instructions (Addendum)
 Follow-up with primary care or urgent care or return to the ER in 1 week for suture removal.  You can use Tylenol  and Motrin  as needed for pain.  Keep your wound clean and dry.  You can wash your hands and shower like normal but do not soak your wound.  Without for signs of infection and return if you develop any pus, redness, fevers or drainage from the area.

## 2024-06-08 NOTE — ED Notes (Signed)
 Dressing with bacitracin  applied to L hand including L pinky finger. Distal CMS intact prior to and after dressing placement.
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
